# Patient Record
Sex: Female | Born: 1960 | Race: White | Hispanic: No | Marital: Married | Smoking: Never smoker
Health system: Southern US, Community
[De-identification: ages and names within clinical notes are randomized; demographics above are authoritative.]

## PROBLEM LIST (undated history)

## (undated) DIAGNOSIS — K5730 Diverticulosis of large intestine without perforation or abscess without bleeding: Secondary | ICD-10-CM

## (undated) DIAGNOSIS — E785 Hyperlipidemia, unspecified: Secondary | ICD-10-CM

## (undated) DIAGNOSIS — G4730 Sleep apnea, unspecified: Secondary | ICD-10-CM

## (undated) DIAGNOSIS — G4733 Obstructive sleep apnea (adult) (pediatric): Secondary | ICD-10-CM

## (undated) DIAGNOSIS — C55 Malignant neoplasm of uterus, part unspecified: Secondary | ICD-10-CM

## (undated) DIAGNOSIS — D126 Benign neoplasm of colon, unspecified: Secondary | ICD-10-CM

## (undated) DIAGNOSIS — K219 Gastro-esophageal reflux disease without esophagitis: Secondary | ICD-10-CM

## (undated) DIAGNOSIS — Z8669 Personal history of other diseases of the nervous system and sense organs: Secondary | ICD-10-CM

## (undated) DIAGNOSIS — M8580 Other specified disorders of bone density and structure, unspecified site: Secondary | ICD-10-CM

## (undated) DIAGNOSIS — F329 Major depressive disorder, single episode, unspecified: Secondary | ICD-10-CM

## (undated) DIAGNOSIS — E042 Nontoxic multinodular goiter: Secondary | ICD-10-CM

## (undated) HISTORY — PX: POLYPECTOMY: SHX149

## (undated) HISTORY — DX: Other specified disorders of bone density and structure, unspecified site: M85.80

## (undated) HISTORY — DX: Diverticulosis of large intestine without perforation or abscess without bleeding: K57.30

## (undated) HISTORY — DX: Hyperlipidemia, unspecified: E78.5

## (undated) HISTORY — DX: Gastro-esophageal reflux disease without esophagitis: K21.9

## (undated) HISTORY — DX: Nontoxic multinodular goiter: E04.2

## (undated) HISTORY — PX: MOLE REMOVAL: SHX2046

## (undated) HISTORY — DX: Malignant neoplasm of uterus, part unspecified: C55

## (undated) HISTORY — DX: Benign neoplasm of colon, unspecified: D12.6

## (undated) HISTORY — DX: Obstructive sleep apnea (adult) (pediatric): G47.33

## (undated) HISTORY — DX: Major depressive disorder, single episode, unspecified: F32.9

## (undated) HISTORY — DX: Sleep apnea, unspecified: G47.30

## (undated) HISTORY — PX: OTHER SURGICAL HISTORY: SHX169

## (undated) HISTORY — DX: Gilbert syndrome: E80.4

## (undated) HISTORY — PX: CHOLECYSTECTOMY: SHX55

## (undated) HISTORY — DX: Personal history of other diseases of the nervous system and sense organs: Z86.69

---

## 1993-06-12 HISTORY — PX: ESOPHAGEAL DILATION: SHX303

## 1993-06-12 HISTORY — PX: UPPER GASTROINTESTINAL ENDOSCOPY: SHX188

## 1994-09-21 ENCOUNTER — Encounter (INDEPENDENT_AMBULATORY_CARE_PROVIDER_SITE_OTHER): Payer: Self-pay | Admitting: *Deleted

## 1998-09-02 ENCOUNTER — Other Ambulatory Visit: Admission: RE | Admit: 1998-09-02 | Discharge: 1998-09-02 | Payer: Self-pay | Admitting: Gynecology

## 1999-01-07 ENCOUNTER — Encounter (INDEPENDENT_AMBULATORY_CARE_PROVIDER_SITE_OTHER): Payer: Self-pay | Admitting: *Deleted

## 1999-09-05 ENCOUNTER — Other Ambulatory Visit: Admission: RE | Admit: 1999-09-05 | Discharge: 1999-09-05 | Payer: Self-pay | Admitting: Gynecology

## 1999-09-15 ENCOUNTER — Ambulatory Visit (HOSPITAL_COMMUNITY): Admission: RE | Admit: 1999-09-15 | Discharge: 1999-09-15 | Payer: Self-pay | Admitting: Gynecology

## 1999-09-15 ENCOUNTER — Encounter (INDEPENDENT_AMBULATORY_CARE_PROVIDER_SITE_OTHER): Payer: Self-pay | Admitting: Specialist

## 2000-11-06 ENCOUNTER — Other Ambulatory Visit: Admission: RE | Admit: 2000-11-06 | Discharge: 2000-11-06 | Payer: Self-pay | Admitting: Gynecology

## 2000-11-23 ENCOUNTER — Encounter (INDEPENDENT_AMBULATORY_CARE_PROVIDER_SITE_OTHER): Payer: Self-pay | Admitting: *Deleted

## 2000-11-23 ENCOUNTER — Encounter: Payer: Self-pay | Admitting: Gastroenterology

## 2000-11-23 ENCOUNTER — Encounter: Admission: RE | Admit: 2000-11-23 | Discharge: 2000-11-23 | Payer: Self-pay | Admitting: Gastroenterology

## 2001-11-26 ENCOUNTER — Other Ambulatory Visit: Admission: RE | Admit: 2001-11-26 | Discharge: 2001-11-26 | Payer: Self-pay | Admitting: Gynecology

## 2003-02-23 ENCOUNTER — Other Ambulatory Visit: Admission: RE | Admit: 2003-02-23 | Discharge: 2003-02-23 | Payer: Self-pay | Admitting: Gynecology

## 2003-05-12 ENCOUNTER — Encounter: Payer: Self-pay | Admitting: Internal Medicine

## 2003-05-12 ENCOUNTER — Encounter (INDEPENDENT_AMBULATORY_CARE_PROVIDER_SITE_OTHER): Payer: Self-pay | Admitting: *Deleted

## 2003-05-12 ENCOUNTER — Ambulatory Visit (HOSPITAL_COMMUNITY): Admission: RE | Admit: 2003-05-12 | Discharge: 2003-05-12 | Payer: Self-pay | Admitting: Gastroenterology

## 2003-10-09 ENCOUNTER — Encounter: Payer: Self-pay | Admitting: Internal Medicine

## 2004-03-07 ENCOUNTER — Other Ambulatory Visit: Admission: RE | Admit: 2004-03-07 | Discharge: 2004-03-07 | Payer: Self-pay | Admitting: Gynecology

## 2005-01-30 ENCOUNTER — Ambulatory Visit: Payer: Self-pay | Admitting: Internal Medicine

## 2005-02-10 ENCOUNTER — Ambulatory Visit: Payer: Self-pay | Admitting: Internal Medicine

## 2005-03-13 ENCOUNTER — Other Ambulatory Visit: Admission: RE | Admit: 2005-03-13 | Discharge: 2005-03-13 | Payer: Self-pay | Admitting: Gynecology

## 2005-03-14 ENCOUNTER — Other Ambulatory Visit: Admission: RE | Admit: 2005-03-14 | Discharge: 2005-03-14 | Payer: Self-pay | Admitting: Gynecology

## 2005-03-29 ENCOUNTER — Ambulatory Visit: Payer: Self-pay | Admitting: Gastroenterology

## 2006-08-22 ENCOUNTER — Ambulatory Visit: Payer: Self-pay | Admitting: Gastroenterology

## 2007-01-29 ENCOUNTER — Encounter (INDEPENDENT_AMBULATORY_CARE_PROVIDER_SITE_OTHER): Payer: Self-pay | Admitting: *Deleted

## 2007-01-29 ENCOUNTER — Ambulatory Visit: Payer: Self-pay | Admitting: Internal Medicine

## 2007-01-29 DIAGNOSIS — K219 Gastro-esophageal reflux disease without esophagitis: Secondary | ICD-10-CM

## 2007-01-29 DIAGNOSIS — E785 Hyperlipidemia, unspecified: Secondary | ICD-10-CM

## 2007-01-29 DIAGNOSIS — M25569 Pain in unspecified knee: Secondary | ICD-10-CM

## 2007-01-29 LAB — CONVERTED CEMR LAB
Bilirubin Urine: NEGATIVE
Blood in Urine, dipstick: NEGATIVE
Glucose, Urine, Semiquant: NEGATIVE
Ketones, urine, test strip: NEGATIVE
Nitrite: NEGATIVE
Protein, U semiquant: NEGATIVE
Specific Gravity, Urine: <1.005
Urobilinogen, UA: NEGATIVE
WBC Urine, dipstick: NEGATIVE
pH: 6

## 2007-02-03 LAB — CONVERTED CEMR LAB
ALT: 19 units/L (ref 0–35)
AST: 19 units/L (ref 0–37)
Albumin: 4.1 g/dL (ref 3.5–5.2)
Alkaline Phosphatase: 49 units/L (ref 39–117)
BUN: 10 mg/dL (ref 6–23)
Basophils Absolute: 0.1 10*3/uL (ref 0.0–0.1)
Basophils Relative: 0.7 % (ref 0.0–1.0)
Bilirubin, Direct: 0.1 mg/dL (ref 0.0–0.3)
CO2: 29 meq/L (ref 19–32)
Calcium: 9.3 mg/dL (ref 8.4–10.5)
Chloride: 105 meq/L (ref 96–112)
Cholesterol: 234 mg/dL (ref 0–200)
Creatinine, Ser: 0.7 mg/dL (ref 0.4–1.2)
Direct LDL: 133.2 mg/dL
Eosinophils Absolute: 0.2 10*3/uL (ref 0.0–0.6)
Eosinophils Relative: 2 % (ref 0.0–5.0)
GFR calc Af Amer: 115 mL/min
GFR calc non Af Amer: 95 mL/min
Glucose, Bld: 85 mg/dL (ref 70–99)
HCT: 38 % (ref 36.0–46.0)
HDL: 73.1 mg/dL (ref >39.0)
Hemoglobin: 12.8 g/dL (ref 12.0–15.0)
Lymphocytes Relative: 39 % (ref 12.0–46.0)
MCHC: 33.6 g/dL (ref 30.0–36.0)
MCV: 86.9 fL (ref 78.0–100.0)
Monocytes Absolute: 0.6 10*3/uL (ref 0.2–0.7)
Monocytes Relative: 8.3 % (ref 3.0–11.0)
Neutro Abs: 3.9 10*3/uL (ref 1.4–7.7)
Neutrophils Relative %: 50 % (ref 43.0–77.0)
Platelets: 312 10*3/uL (ref 150–400)
Potassium: 3.5 meq/L (ref 3.5–5.1)
RBC: 4.38 M/uL (ref 3.87–5.11)
RDW: 15.7 % — ABNORMAL HIGH (ref 11.5–14.6)
Rheumatoid fact SerPl-aCnc: <20 intl units/mL — ABNORMAL LOW (ref 0.0–20.0)
Sed Rate: 12 mm/hr (ref 0–25)
Sodium: 140 meq/L (ref 135–145)
TSH: 0.51 microintl units/mL (ref 0.35–5.50)
Total Bilirubin: 1.4 mg/dL — ABNORMAL HIGH (ref 0.3–1.2)
Total CHOL/HDL Ratio: 3.2
Total CK: 61 units/L (ref 7–177)
Total Protein: 7.3 g/dL (ref 6.0–8.3)
Triglycerides: 115 mg/dL (ref 0–149)
Uric Acid, Serum: 3.7 mg/dL (ref 2.4–7.0)
VLDL: 23 mg/dL (ref 0–40)
WBC: 7.8 10*3/uL (ref 4.5–10.5)

## 2007-02-04 ENCOUNTER — Encounter (INDEPENDENT_AMBULATORY_CARE_PROVIDER_SITE_OTHER): Payer: Self-pay | Admitting: *Deleted

## 2007-06-13 DIAGNOSIS — F32A Depression, unspecified: Secondary | ICD-10-CM

## 2007-06-13 HISTORY — DX: Depression, unspecified: F32.A

## 2007-12-23 ENCOUNTER — Ambulatory Visit: Payer: Self-pay | Admitting: Internal Medicine

## 2008-06-12 DIAGNOSIS — C541 Malignant neoplasm of endometrium: Secondary | ICD-10-CM

## 2008-06-12 HISTORY — DX: Malignant neoplasm of endometrium: C54.1

## 2008-06-12 HISTORY — PX: TOTAL ABDOMINAL HYSTERECTOMY: SHX209

## 2008-08-20 ENCOUNTER — Encounter: Admission: RE | Admit: 2008-08-20 | Discharge: 2008-08-20 | Payer: Self-pay | Admitting: Gynecology

## 2008-08-27 ENCOUNTER — Ambulatory Visit: Payer: Self-pay | Admitting: Internal Medicine

## 2008-08-27 DIAGNOSIS — K222 Esophageal obstruction: Secondary | ICD-10-CM

## 2008-08-27 HISTORY — DX: Esophageal obstruction: K22.2

## 2008-10-16 ENCOUNTER — Telehealth: Payer: Self-pay | Admitting: Internal Medicine

## 2008-10-16 ENCOUNTER — Ambulatory Visit: Payer: Self-pay | Admitting: Internal Medicine

## 2008-10-16 DIAGNOSIS — C55 Malignant neoplasm of uterus, part unspecified: Secondary | ICD-10-CM

## 2008-10-16 LAB — CONVERTED CEMR LAB
Cholesterol, target level: 200 mg/dL
HDL goal, serum: 40 mg/dL

## 2008-11-04 ENCOUNTER — Ambulatory Visit: Payer: Self-pay | Admitting: Internal Medicine

## 2008-11-04 DIAGNOSIS — K5730 Diverticulosis of large intestine without perforation or abscess without bleeding: Secondary | ICD-10-CM | POA: Insufficient documentation

## 2008-11-04 DIAGNOSIS — D649 Anemia, unspecified: Secondary | ICD-10-CM

## 2008-11-04 LAB — CONVERTED CEMR LAB
Bilirubin Urine: NEGATIVE
Glucose, Urine, Semiquant: NEGATIVE
Protein, U semiquant: NEGATIVE
Specific Gravity, Urine: 1.01
pH: 6

## 2008-11-08 LAB — CONVERTED CEMR LAB
Basophils Relative: 0 % (ref 0.0–3.0)
Eosinophils Relative: 8.8 % — ABNORMAL HIGH (ref 0.0–5.0)
Folate: 10 ng/mL
Iron: 27 ug/dL — ABNORMAL LOW (ref 42–145)
Lymphocytes Relative: 28.8 % (ref 12.0–46.0)
Neutrophils Relative %: 53.1 % (ref 43.0–77.0)
Platelets: 391 10*3/uL (ref 150.0–400.0)
RBC: 3.88 M/uL (ref 3.87–5.11)
Total CHOL/HDL Ratio: 4
Vitamin B-12: 241 pg/mL (ref 211–911)
WBC: 8 10*3/uL (ref 4.5–10.5)

## 2008-11-10 ENCOUNTER — Telehealth (INDEPENDENT_AMBULATORY_CARE_PROVIDER_SITE_OTHER): Payer: Self-pay | Admitting: *Deleted

## 2008-11-10 ENCOUNTER — Encounter (INDEPENDENT_AMBULATORY_CARE_PROVIDER_SITE_OTHER): Payer: Self-pay | Admitting: *Deleted

## 2008-11-11 ENCOUNTER — Ambulatory Visit: Admission: RE | Admit: 2008-11-11 | Discharge: 2008-11-11 | Payer: Self-pay | Admitting: Gynecologic Oncology

## 2008-12-04 ENCOUNTER — Telehealth (INDEPENDENT_AMBULATORY_CARE_PROVIDER_SITE_OTHER): Payer: Self-pay | Admitting: *Deleted

## 2008-12-18 ENCOUNTER — Ambulatory Visit: Payer: Self-pay | Admitting: Internal Medicine

## 2008-12-18 DIAGNOSIS — F329 Major depressive disorder, single episode, unspecified: Secondary | ICD-10-CM

## 2008-12-22 ENCOUNTER — Encounter (INDEPENDENT_AMBULATORY_CARE_PROVIDER_SITE_OTHER): Payer: Self-pay | Admitting: *Deleted

## 2008-12-22 LAB — CONVERTED CEMR LAB: BUN: 15 mg/dL (ref 6–23)

## 2009-02-03 ENCOUNTER — Ambulatory Visit: Admission: RE | Admit: 2009-02-03 | Discharge: 2009-02-03 | Payer: Self-pay | Admitting: Gynecologic Oncology

## 2009-02-03 ENCOUNTER — Other Ambulatory Visit: Admission: RE | Admit: 2009-02-03 | Discharge: 2009-02-03 | Payer: Self-pay | Admitting: Gynecologic Oncology

## 2009-02-03 ENCOUNTER — Encounter (INDEPENDENT_AMBULATORY_CARE_PROVIDER_SITE_OTHER): Payer: Self-pay | Admitting: Gynecologic Oncology

## 2009-04-29 ENCOUNTER — Ambulatory Visit: Payer: Self-pay | Admitting: Internal Medicine

## 2009-05-02 LAB — CONVERTED CEMR LAB
Basophils Relative: 1.1 % (ref 0.0–3.0)
Cholesterol: 219 mg/dL — ABNORMAL HIGH (ref 0–200)
Eosinophils Relative: 3 % (ref 0.0–5.0)
HCT: 39.4 % (ref 36.0–46.0)
Hgb A1c MFr Bld: 5.3 % (ref 4.6–6.5)
Lymphs Abs: 2.1 10*3/uL (ref 0.7–4.0)
MCV: 94.1 fL (ref 78.0–100.0)
Monocytes Absolute: 0.6 10*3/uL (ref 0.1–1.0)
Monocytes Relative: 12.1 % — ABNORMAL HIGH (ref 3.0–12.0)
Neutrophils Relative %: 43.6 % (ref 43.0–77.0)
RBC: 4.19 M/uL (ref 3.87–5.11)
Total CHOL/HDL Ratio: 3
Triglycerides: 84 mg/dL (ref 0.0–149.0)
VLDL: 16.8 mg/dL (ref 0.0–40.0)
WBC: 5.2 10*3/uL (ref 4.5–10.5)

## 2009-08-09 ENCOUNTER — Encounter: Payer: Self-pay | Admitting: Internal Medicine

## 2010-02-07 ENCOUNTER — Ambulatory Visit: Payer: Self-pay | Admitting: Internal Medicine

## 2010-02-21 ENCOUNTER — Encounter: Payer: Self-pay | Admitting: Internal Medicine

## 2010-03-31 ENCOUNTER — Encounter: Payer: Self-pay | Admitting: Internal Medicine

## 2010-05-03 ENCOUNTER — Encounter: Payer: Self-pay | Admitting: Internal Medicine

## 2010-05-03 ENCOUNTER — Ambulatory Visit: Payer: Self-pay | Admitting: Internal Medicine

## 2010-05-03 DIAGNOSIS — R519 Headache, unspecified: Secondary | ICD-10-CM | POA: Insufficient documentation

## 2010-05-03 DIAGNOSIS — Z9189 Other specified personal risk factors, not elsewhere classified: Secondary | ICD-10-CM | POA: Insufficient documentation

## 2010-05-03 DIAGNOSIS — R51 Headache: Secondary | ICD-10-CM | POA: Insufficient documentation

## 2010-05-03 HISTORY — DX: Headache: R51

## 2010-05-06 LAB — CONVERTED CEMR LAB
Albumin: 4 g/dL (ref 3.5–5.2)
Alkaline Phosphatase: 98 units/L (ref 39–117)
Basophils Absolute: 0.1 10*3/uL (ref 0.0–0.1)
Basophils Relative: 1.2 % (ref 0.0–3.0)
CO2: 28 meq/L (ref 19–32)
Calcium: 9.3 mg/dL (ref 8.4–10.5)
Chloride: 102 meq/L (ref 96–112)
Eosinophils Absolute: 0.1 10*3/uL (ref 0.0–0.7)
Glucose, Bld: 90 mg/dL (ref 70–99)
HCT: 41 % (ref 36.0–46.0)
Hemoglobin: 13.8 g/dL (ref 12.0–15.0)
Lymphocytes Relative: 36 % (ref 12.0–46.0)
Lymphs Abs: 2.2 10*3/uL (ref 0.7–4.0)
MCHC: 33.7 g/dL (ref 30.0–36.0)
MCV: 91.5 fL (ref 78.0–100.0)
Monocytes Absolute: 0.5 10*3/uL (ref 0.1–1.0)
Neutro Abs: 3.1 10*3/uL (ref 1.4–7.7)
RBC: 4.48 M/uL (ref 3.87–5.11)
RDW: 14.2 % (ref 11.5–14.6)
Sodium: 141 meq/L (ref 135–145)
TSH: 0.86 microintl units/mL (ref 0.35–5.50)
Total CHOL/HDL Ratio: 4
Total Protein: 6.7 g/dL (ref 6.0–8.3)
Triglycerides: 141 mg/dL (ref 0.0–149.0)

## 2010-05-19 ENCOUNTER — Ambulatory Visit: Payer: Self-pay | Admitting: Pulmonary Disease

## 2010-05-19 DIAGNOSIS — G4763 Sleep related bruxism: Secondary | ICD-10-CM

## 2010-06-12 DIAGNOSIS — D126 Benign neoplasm of colon, unspecified: Secondary | ICD-10-CM

## 2010-06-12 HISTORY — PX: COLONOSCOPY: SHX174

## 2010-06-12 HISTORY — DX: Benign neoplasm of colon, unspecified: D12.6

## 2010-07-08 ENCOUNTER — Encounter: Payer: Self-pay | Admitting: Pulmonary Disease

## 2010-07-08 ENCOUNTER — Ambulatory Visit (HOSPITAL_BASED_OUTPATIENT_CLINIC_OR_DEPARTMENT_OTHER)
Admission: RE | Admit: 2010-07-08 | Discharge: 2010-07-08 | Payer: Self-pay | Source: Home / Self Care | Attending: Pulmonary Disease | Admitting: Pulmonary Disease

## 2010-07-12 DIAGNOSIS — G4733 Obstructive sleep apnea (adult) (pediatric): Secondary | ICD-10-CM

## 2010-07-12 NOTE — Procedures (Signed)
Summary: 24 hr pH Monitor Study/[facility]  24 hr pH Monitor Study/[facility]   Imported By: Sherian Rein 02/08/2010 12:23:12  _____________________________________________________________________  External Attachment:    Type:   Image     Comment:   External Document

## 2010-07-12 NOTE — Letter (Signed)
Summary: [facility] Gynecologic Oncology  [facility] Gynecologic Oncology   Imported By: Lanelle Bal 03/02/2010 09:28:45  _____________________________________________________________________  External Attachment:    Type:   Image     Comment:   External Document

## 2010-07-12 NOTE — Assessment & Plan Note (Signed)
Summary: SNORING///KP   Visit Type:  Initial Consult Copy to:  Dr. Alwyn Ren Primary Provider/Referring Provider:  Marga Melnick, MD  CC:  Sleep consult.Marland KitchenMarland KitchenEpworth score is 5..  History of Present Illness: 50 yo female with sleep difficulties.  She was diagnosed with uterine cancer about 2 years ago and had hysterectomy with removal of her ovaries.  After surgery she has gained about 20 lbs.  With this change in her weight her sleep has gotten worse, particularly over the past several months.  She has also been getting headaches in the morning.  As a result sleep consultation was requested.  She snores, and her husband now sleeps in a separate room.  She will wake up with a cough and gasp.  She has trouble sleeping on her back.  She goes to bed at 11pm, and falls asleep quickly.  She use the bathroom once per night.  She gets out of bed at 7 am, but this is a struggle.  She seems to always have a headache now.  She does not use anything to help her sleep or stay awake.  She does have reflux, and this seems to be worse at night.  She does grind her teeth while asleep, and this seems to be worse recently.  She denies sleep walking, sleep talking, or nightmares.  There is no history of restless legs.  She denies sleep hallucination, sleep paralysis, or cataplexy.  There is no history of thyroid disease.  She does not smoke cigarettes, or drink alcohol.   Preventive Screening-Counseling & Management  Alcohol-Tobacco     Smoking Status: never  Current Medications (verified): 1)  Prevacid 30 Mg Cpdr (Lansoprazole) .Marland Kitchen.. 1 By Mouth Two Times A Day 2)  Fish Oil 1000 Mg Caps (Omega-3 Fatty Acids) .... Take One By Mouth Once Daily 3)  Citalopram Hydrobromide 20 Mg Tabs (Citalopram Hydrobromide) .Marland Kitchen.. 1 Once Daily 4)  Furosemide 20 Mg Tabs (Furosemide) .Marland Kitchen.. 1 Once Daily As Needed Edema  Allergies (verified): 1)  ! Axid 2)  Compazine 3)  Pcn 4)  Prilosec 5)  Lipitor 6)  Nexium  Past  History:  Past Medical History: Reviewed history from 05/03/2010 and no changes required. GERD diagnosed in  November 11, 1993 extensively evaluated by GI, seen every 2 yrs; Esophageal dilation 11/11/93 Hyperlipidemia: LDL goal = < 160 based on  both  Framingham Study & NMR Lipoprofile, ideally < 125 (NMR 2002-11-12: LDL 168 with 1788 total / 124 small dense particles, HDL 72, TG 229) Gilbert's Syndrome Diverticulosis, colon Uterine Cancer Nov 11, 2008, Dr Greta Doom & Dr Kyla Balzarine, [facility]  Past Surgical History: Reviewed history from 05/03/2010 and no changes required. Cholecystectomy 1989 G2 P2; Flex Sigmoidoscopy  1995; Esophgeal dilation 1995;  Robotic TAH  & BSO & LN resection for Stage 1A  uterine cancer  Family History: Reviewed history from 05/03/2010 and no changes required. Father: HTN, MI @ 80, stents, died November 11, 2009 of metastatic melanoma Mother:breast cancer ,lipids  Siblings: bro: HTN  Paternal  Uncle: colon cancer  MGF: prostate cancer daughter : migraines  Social History: Reviewed history from 05/03/2010 and no changes required. 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  Alcohol Use - no Daily Caffeine Use:  2  diet sodas Regular exercise-no  Review of Systems       The patient complains of productive cough, non-productive cough, acid heartburn, indigestion, difficulty swallowing, headaches, nasal congestion/difficulty breathing through nose, hand/feet swelling, and joint stiffness or pain.  The patient denies shortness of breath with activity,  shortness of breath at rest, coughing up blood, chest pain, irregular heartbeats, loss of appetite, weight change, abdominal pain, sore throat, tooth/dental problems, sneezing, itching, ear ache, anxiety, depression, rash, change in color of mucus, and fever.    Vital Signs:  Patient profile:   50 year old female Height:      64.75 inches (164.47 cm) Weight:      172.50 pounds (78.41 kg) BMI:     29.03 O2 Sat:      99 % on Room  air Temp:     98.1 degrees F (36.72 degrees C) oral Pulse rate:   57 / minute BP sitting:   126 / 70  (left arm) Cuff size:   large  Vitals Entered By: Michel Bickers CMA (May 19, 2010 11:40 AM)  O2 Sat at Rest %:  99 O2 Flow:  Room air CC: Sleep consult.Marland KitchenMarland KitchenEpworth score is 5. Comments Medications reviewed with patient Michel Bickers [facility]  May 19, 2010 11:41 AM   Physical Exam  General:  normal appearance and healthy appearing.   Eyes:  PERRLA and EOMI, wears glasses Nose:  narrow nasal angles Mouth:  MP 4, scalloped tongue, elongated uvula Neck:  no JVD.   Chest Wall:  no deformities noted Lungs:  clear bilaterally to auscultation and percussion Heart:  regular rate and rhythm, S1, S2 without murmurs, rubs, gallops, or clicks Abdomen:  bowel sounds positive; abdomen soft and non-tender without masses, or organomegaly Extremities:  no clubbing, cyanosis, edema, or deformity noted Neurologic:  normal CN II-XII and strength normal.   Cervical Nodes:  no significant adenopathy Psych:  alert and cooperative; normal mood and affect; normal attention span and concentration   Impression & Recommendations:  Problem # 1:  SNORING, HX OF (ICD-V15.89) She has sleep disruption and daytime sleepiness.  I am concerned she has sleep apnea.  I have discussed how sleep apnea can affect her health.  Driving precautions were reviewed.  Discussed the importance of weight reduction.  Will schedule sleep test to further evaluate.  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  Complete Medication List: 1)  Prevacid 30 Mg Cpdr (Lansoprazole) .Marland Kitchen.. 1 by mouth two times a day 2)  Fish Oil 1000 Mg Caps (Omega-3 fatty acids) .... Take one by mouth once daily 3)  Citalopram Hydrobromide 20 Mg Tabs (Citalopram hydrobromide) .Marland Kitchen.. 1 once daily 4)  Furosemide 20 Mg Tabs (Furosemide) .Marland Kitchen.. 1 once daily as needed edema  Other Orders: Est. Patient Level IV  (56387) Sleep Disorder Referral (Sleep Disorder)  Patient Instructions: 1)  Will schedule sleep test 2)  Will call to schedule follow up after sleep test reviewed

## 2010-07-12 NOTE — Miscellaneous (Signed)
Summary: Change Prevacid Rx. to capsules  Clinical Lists Changes  Medications: Changed medication from PREVACID SOLUTAB 30 MG TBDP (LANSOPRAZOLE) Take 1 tablet by mouth two times a day to PREVACID 30 MG CPDR (LANSOPRAZOLE) 1 by mouth two times a day - Signed Rx of PREVACID 30 MG CPDR (LANSOPRAZOLE) 1 by mouth two times a day;  #60 x 11;  Signed;  Entered by: Milford Cage NCMA;  Authorized by: Hilarie Fredrickson MD;  Method used: Electronically to [facility] [phone]*, [address] [place], [place]  [address], Ph: [phone], Fax: [phone]    Prescriptions: PREVACID 30 MG CPDR (LANSOPRAZOLE) 1 by mouth two times a day  #60 x 11   Entered by:   Milford Cage NCMA   Authorized by:   Hilarie Fredrickson MD   Signed by:   Milford Cage NCMA on 03/31/2010   Method used:   Electronically to        [facility] [phone]* (retail)       [address] [place], [place]  [address]       Ph: [phone]       Fax: [phone]   RxID:   [id number]

## 2010-07-12 NOTE — Procedures (Signed)
Summary: Upper Endoscopy/[facility]  Upper Endoscopy/[facility]   Imported By: Sherian Rein 02/08/2010 12:17:36  _____________________________________________________________________  External Attachment:    Type:   Image     Comment:   External Document

## 2010-07-12 NOTE — Letter (Signed)
Summary: [facility] Instructions  [facility]  [address] [place], [place] [address]   Phone: [phone]  Fax: [phone]       Penny Valdez    1961-05-25    MRN: [id number]        Procedure Day /Date:TUESDAY, 03/22/10     Arrival Time:1:30 PM     Procedure Time:2:30 PM     Location of Procedure:                    X  [facility] (4th Floor)   PREPARATION FOR COLONOSCOPY WITH MOVIPREP   Starting 5 days prior to your procedure 03/17/10 do not eat nuts, seeds, popcorn, corn, beans, peas,  salads, or any raw vegetables.  Do not take any fiber supplements (e.g. Metamucil, Citrucel, and Benefiber).  THE DAY BEFORE YOUR PROCEDURE         DATE: 03/18/10  DAY: MONDAY  1.  Drink clear liquids the entire day-NO SOLID FOOD  2.  Do not drink anything colored red or purple.  Avoid juices with pulp.  No orange juice.  3.  Drink at least 64 oz. (8 glasses) of fluid/clear liquids during the day to prevent dehydration and help the prep work efficiently.  CLEAR LIQUIDS INCLUDE: Water Jello Ice Popsicles Tea (sugar ok, no milk/cream) Powdered fruit flavored drinks Coffee (sugar ok, no milk/cream) Gatorade Juice: apple, white grape, white cranberry  Lemonade Clear bullion, consomm, broth Carbonated beverages (any kind) Strained chicken noodle soup Hard Candy                             4.  In the morning, mix first dose of MoviPrep solution:    Empty 1 Pouch A and 1 Pouch B into the disposable container    Add lukewarm drinking water to the top line of the container. Mix to dissolve    Refrigerate (mixed solution should be used within 24 hrs)  5.  Begin drinking the prep at 5:00 p.m. The MoviPrep container is divided by 4 marks.   Every 15 minutes drink the solution down to the next mark (approximately 8 oz) until the full liter is complete.   6.  Follow completed prep with 16 oz of clear liquid of your choice (Nothing red or purple).  Continue to  drink clear liquids until bedtime.  7.  Before going to bed, mix second dose of MoviPrep solution:    Empty 1 Pouch A and 1 Pouch B into the disposable container    Add lukewarm drinking water to the top line of the container. Mix to dissolve    Refrigerate  THE DAY OF YOUR PROCEDURE      DATE: 03/22/10 DAY: TUESDAY  Beginning at 9:30 a.m. (5 hours before procedure):         1. Every 15 minutes, drink the solution down to the next mark (approx 8 oz) until the full liter is complete.  2. Follow completed prep with 16 oz. of clear liquid of your choice.    3. You may drink clear liquids until 12:30 PM (2 HOURS BEFORE PROCEDURE).   MEDICATION INSTRUCTIONS  Unless otherwise instructed, you should take regular prescription medications with a small sip of water   as early as possible the morning of your procedure.        OTHER INSTRUCTIONS  You will need a responsible adult at least 51 years of age to  accompany you and drive you home.   This person must remain in the waiting room during your procedure.  Wear loose fitting clothing that is easily removed.  Leave jewelry and other valuables at home.  However, you may wish to bring a book to read or  an iPod/MP3 player to listen to music as you wait for your procedure to start.  Remove all body piercing jewelry and leave at home.  Total time from sign-in until discharge is approximately 2-3 hours.  You should go home directly after your procedure and rest.  You can resume normal activities the  day after your procedure.  The day of your procedure you should not:   Drive   Make legal decisions   Operate machinery   Drink alcohol   Return to work  You will receive specific instructions about eating, activities and medications before you leave.    The above instructions have been reviewed and explained to me by   _______________________    I fully understand and can verbalize these instructions  _____________________________ Date _________

## 2010-07-12 NOTE — Assessment & Plan Note (Signed)
Summary: cpx & lab/cbs   Vital Signs:  Patient profile:   50 year old female Height:      64.75 inches Weight:      171.2 pounds BMI:     28.81 Temp:     98.2 degrees F oral Pulse rate:   64 / minute Resp:     14 per minute BP sitting:   130 / 88  (left arm) Cuff size:   large  Vitals Entered By: Shonna Chock CMA (May 03, 2010 8:40 AM) CC: CPX and fasting labs , Lipid Management Comments Height recorded with shoes on Refused flu vaccine   Primary Care Provider:  Marga Melnick, MD  CC:  CPX and fasting labs  and Lipid Management.  History of Present Illness: Mrs.Timson is here for a physical; she has had more frequent headaches recently in context of stress.  No associated prodrome or aura present.These are relieved by Excedrin as needed. She lost her father this month to metastatic melanoma. Also her family is concerned about severe snoring. No apnea reported .  Lipid Management History:      Positive NCEP/ATP III risk factors include family history for ischemic heart disease (males less than 43 years old).  Negative NCEP/ATP III risk factors include female age less than 13 years old, non-diabetic, HDL cholesterol greater than 60, non-tobacco-user status, non-hypertensive, no ASHD (atherosclerotic heart disease), no prior stroke/TIA, no peripheral vascular disease, and no history of aortic aneurysm.     Current Medications (verified): 1)  Prevacid 30 Mg Cpdr (Lansoprazole) .Marland Kitchen.. 1 By Mouth Two Times A Day 2)  Fish Oil 1000 Mg Caps (Omega-3 Fatty Acids) .... Take One By Mouth Once Daily 3)  Citalopram Hydrobromide 20 Mg Tabs (Citalopram Hydrobromide) .Marland Kitchen.. 1 Once Daily 4)  Furosemide 20 Mg Tabs (Furosemide) .Marland Kitchen.. 1 Once Daily As Needed Edema  Allergies: 1)  ! Axid 2)  Compazine 3)  Pcn 4)  Prilosec 5)  Lipitor 6)  Nexium  Past History:  Past Medical History: GERD diagnosed in  1993/10/25 extensively evaluated by GI, seen every 2 yrs; Esophageal dilation  1993/10/25 Hyperlipidemia: LDL goal = < 160 based on  both  [place] Study & NMR Lipoprofile, ideally < 125 (NMR 10/26/2002: LDL 168 with 1788 total / 161 small dense particles, HDL 72, TG 229) Gilbert's Syndrome Diverticulosis, colon Uterine Cancer 2008-10-25, Dr Greta Doom & Dr Kyla Balzarine, [facility]  Past Surgical History: Cholecystectomy 10/26/1987 G2 P2; Flex Sigmoidoscopy  Oct 25, 1993; Esophgeal dilation 1995;  Robotic TAH  & BSO & LN resection for Stage 1A  uterine cancer  Family History: Father: HTN, MI @ 64, stents, died 10-25-09 of metastatic melanoma Mother:breast cancer ,lipids  Siblings: bro: HTN  Paternal  Uncle: colon cancer  MGF: prostate cancer daughter : migraines  Social History: Married  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  Alcohol Use - no Daily Caffeine Use:  2  diet sodas Regular exercise-no  Review of Systems General:  Complains of fatigue and sleep disorder. Eyes:  Denies blurring, double vision, and vision loss-both eyes. ENT:  Complains of nasal congestion; denies hoarseness and postnasal drainage; Occasional swallowing issues due to globus sensation. CV:  Denies chest pain or discomfort, leg cramps with exertion, and palpitations; Occasional limb edema , worse in heat. Resp:  Complains of hypersomnolence and morning headaches; denies cough and sputum productive. GI:  Denies abdominal pain, bloody stools, dark tarry stools, indigestion, loss of appetite, and vomiting; Some intermittent nausea. No dysphagia. GU:  Denies discharge, dysuria,  and hematuria. MS:  Denies joint pain, joint redness, joint swelling, low back pain, mid back pain, and thoracic pain. Derm:  Denies dryness, hair loss, and lesion(s); nails brittle. Neuro:  Denies numbness, tingling, and weakness. Psych:  Complains of easily tearful; denies anxiety, depression, easily angered, and irritability. Endo:  Denies cold intolerance, excessive hunger, excessive thirst, excessive urination, and heat  intolerance. Heme:  Denies abnormal bruising and bleeding. Allergy:  Complains of itching eyes; denies sneezing.  Physical Exam  General:  well-nourished;alert,appropriate and cooperative throughout examination Head:  Normocephalic and atraumatic without obvious abnormalities.  Eyes:  No corneal or conjunctival inflammation noted.Perrla. Funduscopic exam benign, without hemorrhages, exudates or papilledema.  Ears:  External ear exam shows no significant lesions or deformities.  Otoscopic examination reveals clear canals, tympanic membranes are intact bilaterally without bulging, retraction, inflammation or discharge. Hearing is grossly normal bilaterally. Nose:  External nasal examination shows no deformity or inflammation. Nasal mucosa are pink and moist without lesions or exudates. Mouth:  Oral mucosa and oropharynx without lesions or exudates.  Teeth in good repair. Slight crowding of oropharynx Neck:  No deformities, masses, or tenderness noted. Physiologic  asymmetry of thyroid, R>  Lungs:  Normal respiratory effort, chest expands symmetrically. Lungs are clear to auscultation, no crackles or wheezes. Heart:  regular rhythm, no murmur, no gallop, no rub, no JVD, no HJR, and bradycardia.   Abdomen:  Bowel sounds positive,abdomen soft and non-tender without masses, organomegaly or hernias noted. Genitalia:  Dr Greta Doom Msk:  No deformity or scoliosis noted of thoracic or lumbar spine.   Pulses:  R and L carotid,radial,dorsalis pedis and posterior tibial pulses are full and equal bilaterally Extremities:  No clubbing, cyanosis, edema, or deformity noted with normal full range of motion of all joints.   Neurologic:  alert & oriented X3 and DTRs symmetrical and normal.   Skin:  Intact without suspicious lesions or rashes Cervical Nodes:  No lymphadenopathy noted Axillary Nodes:  No palpable lymphadenopathy Psych:  memory intact for recent and remote, normally interactive, and good eye contact.      Impression & Recommendations:  Problem # 1:  ROUTINE GENERAL MEDICAL EXAM@HEALTH  CARE FACL (ICD-V70.0)  Orders: EKG w/ Interpretation (93000) Venipuncture (16109) TLB-Lipid Panel (80061-LIPID) TLB-BMP (Basic Metabolic Panel-BMET) (80048-METABOL) TLB-CBC Platelet - w/Differential (85025-CBCD) TLB-Hepatic/Liver Function Pnl (80076-HEPATIC) TLB-TSH (Thyroid Stimulating Hormone) (84443-TSH)  Problem # 2:  HEADACHE (ICD-784.0) in context of family health stress  Problem # 3:  SNORING, HX OF (ICD-V15.89)  with hypersomnulence & am headaches, R/O Sleep Apnea  Orders: Sleep Disorder Referral (Sleep Disorder)  Problem # 4:  HYPERLIPIDEMIA NEC/NOS (ICD-272.4)  Problem # 5:  GERD (ICD-530.81)  Her updated medication list for this problem includes:    Prevacid 30 Mg Cpdr (Lansoprazole) .Marland Kitchen... 1 by mouth two times a day  Complete Medication List: 1)  Prevacid 30 Mg Cpdr (Lansoprazole) .Marland Kitchen.. 1 by mouth two times a day 2)  Fish Oil 1000 Mg Caps (Omega-3 fatty acids) .... Take one by mouth once daily 3)  Citalopram Hydrobromide 20 Mg Tabs (Citalopram hydrobromide) .Marland Kitchen.. 1 once daily 4)  Furosemide 20 Mg Tabs (Furosemide) .Marland Kitchen.. 1 once daily as needed edema 5)  Fluticasone Propionate 50 Mcg/act Susp (Fluticasone propionate) .Marland Kitchen.. 1 spray two times a day as needed for nasal spray  Other Orders: Tdap => 64yrs IM (60454) Admin 1st Vaccine (09811)  Lipid Assessment/Plan:      Based on NCEP/ATP III, the patient's risk factor category is "0-1 risk factors".  The patient's lipid goals are as follows: Total cholesterol goal is 200; LDL cholesterol goal is 160; HDL cholesterol goal is 40; Triglyceride goal is 150.  Her LDL cholesterol goal has been met.    Patient Instructions: 1)  Please reschedule Colonoscopy  . Use a Neti Pot once daily as needed for nasal congestion. 2)  Avoid foods high in acid (tomatoes, citrus juices, spicy foods). Avoid eating within two hours of lying down or before  exercising. Do not over eat; try smaller more frequent meals. Elevate head of bed twelve inches when sleeping. Prescriptions: FLUTICASONE PROPIONATE 50 MCG/ACT SUSP (FLUTICASONE PROPIONATE) 1 spray two times a day as needed for nasal spray  #1 x 5   Entered and Authorized by:   Marga Melnick MD   Signed by:   Marga Melnick MD on 05/03/2010   Method used:   Print then Give to Patient   RxID:   [id number] FUROSEMIDE 20 MG TABS (FUROSEMIDE) 1 once daily as needed edema  #90 x 3   Entered and Authorized by:   Marga Melnick MD   Signed by:   Marga Melnick MD on 05/03/2010   Method used:   Print then Give to Patient   RxID:   [id number] CITALOPRAM HYDROBROMIDE 20 MG TABS (CITALOPRAM HYDROBROMIDE) 1 once daily  #90 x 3   Entered and Authorized by:   Marga Melnick MD   Signed by:   Marga Melnick MD on 05/03/2010   Method used:   Print then Give to Patient   RxID:   [id number]    Orders Added: 1)  Tdap => 21yrs IM [90715] 2)  Admin 1st Vaccine [90471] 3)  Est. Patient 40-64 years [99396] 4)  EKG w/ Interpretation [93000] 5)  Venipuncture [36415] 6)  TLB-Lipid Panel [80061-LIPID] 7)  TLB-BMP (Basic Metabolic Panel-BMET) [80048-METABOL] 8)  TLB-CBC Platelet - w/Differential [85025-CBCD] 9)  TLB-Hepatic/Liver Function Pnl [80076-HEPATIC] 10)  TLB-TSH (Thyroid Stimulating Hormone) [84443-TSH] 11)  Sleep Disorder Referral [Sleep Disorder]   Immunizations Administered:  Tetanus Vaccine:    Vaccine Type: Tdap    Site: right deltoid    Mfr: GlaxoSmithKline    Dose: 0.5 ml    Route: IM    Given by: Shonna Chock CMA    Exp. Date: 03/31/2012    Lot #: [id number]    VIS given: 04/29/08 version given May 03, 2010.   Immunizations Administered:  Tetanus Vaccine:    Vaccine Type: Tdap    Site: right deltoid    Mfr: GlaxoSmithKline    Dose: 0.5 ml    Route: IM    Given by: Shonna Chock CMA    Exp. Date: 03/31/2012    Lot #: [id number]    VIS given:  04/29/08 version given May 03, 2010.   Appended Document: cpx & lab/cbs

## 2010-07-12 NOTE — Assessment & Plan Note (Signed)
Summary: GERD symptoms / meds / colon cancer screening    History of Present Illness Visit Type: Follow-up Visit Primary GI MD: Yancey Flemings MD Primary Provider: Marga Melnick, MD Chief Complaint: Patient here for follow up of her gerd. She complains of epigastric pain that started about 2 months ago. She is also having a BM most days while eating lunch, she denies diarrhea.  History of Present Illness:   50 year old female with GERD, but due to peptic stricture, hyperlipidemia, and stage IA uterine cancer for which she is status post hysterectomy. She presents today regarding GERD and followup. Patient has been using Prevacid solutab's once daily. Despite this, she has had significant breakthrough symptoms with dietary indiscretion as well as nocturnally. She will take Rolaids as needed. This helps. No recurrent dysphagia. She has had 10 pound weight gain over the past year. She does his epigastric discomfort associated with breakthrough GERD. She also reports that need to defecate urgently, a formed stool, while eating lunch. We also discussed colon cancer screening.   GI Review of Systems    Reports abdominal pain, acid reflux, and  heartburn.     Location of  Abdominal pain: epigastric area.    Denies belching, bloating, chest pain, dysphagia with liquids, dysphagia with solids, loss of appetite, nausea, vomiting, vomiting blood, weight loss, and  weight gain.      Reports change in bowel habits.     Denies anal fissure, black tarry stools, constipation, diarrhea, diverticulosis, fecal incontinence, heme positive stool, hemorrhoids, irritable bowel syndrome, jaundice, light color stool, liver problems, rectal bleeding, and  rectal pain.    Current Medications (verified): 1)  Prevacid Solutab 30 Mg Tbdp (Lansoprazole) .... Take 1 Tablet By Mouth Once A Day 2)  Multivitamins  Tabs (Multiple Vitamin) .... Take 1 Tablet By Mouth Once A Day 3)  Fish Oil 1000 Mg Caps (Omega-3 Fatty Acids) ....  Take One By Mouth Once Daily 4)  Citalopram Hydrobromide 20 Mg Tabs (Citalopram Hydrobromide) .Marland Kitchen.. 1 Once Daily 5)  Furosemide 20 Mg Tabs (Furosemide) .Marland Kitchen.. 1 Once Daily As Needed Edema  Allergies (verified): 1)  ! Axid 2)  Compazine 3)  Pcn 4)  Prilosec 5)  Lipitor 6)  Nexium  Past History:  Past Medical History: GERD Dagnosed in  1995 extensively evaluated by GI, seen every 2 yrs; esophageal dilation 1995 Hyperlipidemia, LDL goal = < 160 based on NMR Lipoprofile, ideally < 125 Gilbert's Syndrome Diverticulosis, colon Uterine Cancer 2010  Past Surgical History: Cholecystectomy G2P2; Flex Sig 1995; Esophgeal dilation 1995;  Robotic TAH  & BSO & LN resection for Stage 1A  uterine CA  Family History: Father: HTN, MI @ 33, stents Mother:breast CA ,lipids  Siblings: bro HTN Family History of Colon Cancer: Pat Uncle Family History of Prostate Cancer: MGF Father Melanoma cancer daughter  migraines  Social History: Reviewed history from 10/16/2008 and no changes required. 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  Alcohol Use - no Daily Caffeine Use: sodas Illicit Drug Use - no Regular exercise-no  Review of Systems       The patient complains of back pain and swelling of feet/legs.  The patient denies allergy/sinus, anemia, anxiety-new, arthritis/joint pain, blood in urine, breast changes/lumps, change in vision, confusion, cough, coughing up blood, depression-new, fainting, fatigue, fever, headaches-new, hearing problems, heart murmur, heart rhythm changes, itching, menstrual pain, muscle pains/cramps, night sweats, nosebleeds, pregnancy symptoms, shortness of breath, skin rash, sleeping problems, sore throat, swollen lymph glands, thirst -  excessive , urination - excessive , urination changes/pain, urine leakage, vision changes, and voice change.    Vital Signs:  Patient profile:   50 year old female Height:      63.75  inches Weight:      169.6 pounds BMI:     29.45 Pulse rate:   76 / minute Pulse rhythm:   regular BP sitting:   106 / 68  (left arm) Cuff size:   regular  Vitals Entered By: Harlow Mares CMA Duncan Dull) (February 07, 2010 4:13 PM)  Physical Exam  General:  Well developed, well nourished, no acute distress. Head:  Normocephalic and atraumatic. Eyes:  PERRLA, no icterus. Mouth:  No deformity or lesions, dentition normal. Lungs:  Clear throughout to auscultation. Heart:  Regular rate and rhythm; no murmurs, rubs,  or bruits. Abdomen:  Soft, nontender and nondistended. No masses, hepatosplenomegaly or hernias noted. Normal bowel sounds. Rectal:  deferred until colonoscopy Msk:  Symmetrical with no gross deformities. Normal posture. Pulses:  Normal pulses noted. Extremities:  No clubbing, cyanosis, edema or deformities noted. Neurologic:  Alert and  oriented x4;  grossly normal neurologically. Skin:  Intact without significant lesions or rashes. Psych:  Alert and cooperative. Normal mood and affect.   Impression & Recommendations:  Problem # 1:  GERD (ICD-530.81) GERD with a history of peptic stricture (530.3). Now with significant breakthrough symptoms on once daily Prevacid solutab. Associated epigastric pain (789.06) related to the same. Fortunately,, no recurrent dysphagia. Suspect weight gain the cause for problems over the past year.  Plan: #1. Reflux precautions with attention to weight loss #2. Refill Prevacid solutab. Increased dosage to 30 mg b.i.d. #3. Routine GERD followup in one year.  Problem # 2:  SPECIAL SCREENING FOR MALIGNANT NEOPLASMS COLON (ICD-V76.51) the patient is an appropriate candidate for screening colonoscopy without contraindication. The nature of the procedure as well as the risks, benefits, and alternatives were reviewed in detail. She understood and agreed to proceed. Movi prep prescribed. The patient instructed on its use.  Other Orders: Colonoscopy  (Colon)  Patient Instructions: 1)  Copy sent to : Marga Melnick, MD 2)  Colonoscopy [facility] 03/22/10 2:30 pm arrive at 1:30 pm 3)  Movi prep instructions given to patient. 4)  Movi prep Rx. sent to pharmacy. 5)  Increase your Prevacid solutabs to two times a day #60 x 11 RFs. 6)  The medication list was reviewed and reconciled.  All changed / newly prescribed medications were explained.  A complete medication list was provided to the patient / caregiver. Prescriptions: PREVACID SOLUTAB 30 MG TBDP (LANSOPRAZOLE) Take 1 tablet by mouth two times a day  #90 x 3   Entered by:   Milford Cage NCMA   Authorized by:   Hilarie Fredrickson MD   Signed by:   Milford Cage NCMA on 02/07/2010   Method used:   Electronically to        [facility] [phone]* (retail)       [address] [place], [place]  [address]       Ph: [phone]       Fax: [phone]   RxID:   [id number] MOVIPREP 100 GM  SOLR (PEG-KCL-NACL-NASULF-NA ASC-C) As per prep instructions.  #1 x 0   Entered by:   Milford Cage NCMA   Authorized by:   Hilarie Fredrickson MD   Signed by:   Milford Cage NCMA on 02/07/2010   Method used:  Electronically to        [facility] [phone]* (retail)       [address] [place], [place]  [address]       Ph: [phone]       Fax: [phone]   RxID:   [id number]

## 2010-07-14 ENCOUNTER — Telehealth (INDEPENDENT_AMBULATORY_CARE_PROVIDER_SITE_OTHER): Payer: Self-pay | Admitting: *Deleted

## 2010-07-14 DIAGNOSIS — G4733 Obstructive sleep apnea (adult) (pediatric): Secondary | ICD-10-CM | POA: Insufficient documentation

## 2010-07-15 NOTE — Letter (Signed)
Summary: [facility]  [facility]   Imported By: Lanelle Bal 08/19/2009 10:53:30  _____________________________________________________________________  External Attachment:    Type:   Image     Comment:   External Document

## 2010-07-20 NOTE — Miscellaneous (Addendum)
Summary: Split night sleep study   Clinical Lists Changes AHI 21.7, significant REM and positional affect.  PLMI 0.  Used CPAP 7 cm with AHI down to 0.  Observed in REM and supine sleep.  Left message with patient's family member, and ask for patient to call back to discuss results.  Appended Document: Split night sleep study   Discussed result with pt over the phone.  Will set up CPAP 7 cm H2O.  Will have my nurse schedule ROV in 6 to 8 weeks.  Clinical Lists Changes  Problems: Added new problem of OBSTRUCTIVE SLEEP APNEA (ICD-327.23) Orders: Added new Referral order of DME Referral (DME) - Signed      Appended Document: Split night sleep study lmomtcb x1 to schedule rov  Appended Document: Split night sleep study lmomtcb x2  Appended Document: Split night sleep study pt is coming in 3/19 at 10:30

## 2010-07-20 NOTE — Progress Notes (Signed)
Summary: sleep results<<<dr sood spoke w/ patient  Phone Note Call from Patient   Caller: Patient Call For: sood Summary of Call: patient phoned stated that she was returning a call to Dr Craige Cotta for her test results of her sleep test. She can be reached at [phone] Initial call taken by: Vedia Coffer,  July 14, 2010 11:57 AM  Follow-up for Phone Call        Spoke with pt over the phone. Follow-up by: Coralyn Helling MD,  July 14, 2010 12:27 PM

## 2010-07-27 ENCOUNTER — Encounter: Payer: Self-pay | Admitting: Pulmonary Disease

## 2010-08-29 ENCOUNTER — Ambulatory Visit (INDEPENDENT_AMBULATORY_CARE_PROVIDER_SITE_OTHER): Payer: BC Managed Care – PPO | Admitting: Pulmonary Disease

## 2010-08-29 ENCOUNTER — Encounter: Payer: Self-pay | Admitting: Pulmonary Disease

## 2010-08-29 DIAGNOSIS — G4733 Obstructive sleep apnea (adult) (pediatric): Secondary | ICD-10-CM

## 2010-09-08 NOTE — Assessment & Plan Note (Signed)
Summary: follow up on cpap/ms   Copy to:  Dr. Alwyn Ren Primary Provider/Referring Provider:  Marga Melnick, MD  CC:  follow up on cpap. Pt states she wears her cpap everynight x8 hrs a night. Pt states her mask leaves marks on the top of her nose and becomes irritated. Marland Kitchen  History of Present Illness: 50 yo female with moderate OSA using CPAP 7 cm.  Sleep study from Jul 08, 2010>>AHI 21.7, significant REM and positional affect.  PLMI 0.  Used CPAP 7 cm with AHI down to 0.  Observed in REM and supine sleep.  Since then she has been set up with CPAP.  She is using every night.  She is sleeping better, and no longer snores.  She has more energy during the day.  She has a full face mask and heated humidity.  She gets occasional irritation over her nose from the mask.  Otherwise she is quite pleased with response to CPAP.  Current Medications (verified): 1)  Prevacid 30 Mg Cpdr (Lansoprazole) .Marland Kitchen.. 1 By Mouth Two Times A Day 2)  Fish Oil 1000 Mg Caps (Omega-3 Fatty Acids) .... Take One By Mouth Once Daily 3)  Furosemide 20 Mg Tabs (Furosemide) .Marland Kitchen.. 1 Once Daily As Needed Edema 4)  Cpap .... Uses At Night  Allergies (verified): 1)  ! Axid 2)  Compazine 3)  Pcn 4)  Prilosec 5)  Lipitor 6)  Nexium  Past History:  Past Medical History: GERD diagnosed in  Oct 28, 1993 extensively evaluated by GI, seen every 2 yrs; Esophageal dilation 1993-10-28 Hyperlipidemia: LDL goal = < 160 based on  both  [place] Study & NMR Lipoprofile, ideally < 125 (NMR Oct 29, 2002: LDL 168 with 1788 total / 914 small dense particles, HDL 72, TG 229) Gilbert's Syndrome Diverticulosis, colon Uterine Cancer 2008-10-28, Dr Greta Doom & Dr Kyla Balzarine, [facility] Obstructive sleep apnea      - PSG 07/08/10>>AHI 21.7      - CPAP 7 cm  Past Surgical History: Reviewed history from 05/03/2010 and no changes required. Cholecystectomy 1989 G2 P2; Flex Sigmoidoscopy  1995; Esophgeal dilation 1995;  Robotic TAH  & BSO & LN resection for Stage 1A  uterine  cancer  Family History: Reviewed history from 05/03/2010 and no changes required. Father: HTN, MI @ 60, stents, died Oct 28, 2009 of metastatic melanoma Mother:breast cancer ,lipids  Siblings: bro: HTN  Paternal  Uncle: colon cancer  MGF: prostate cancer daughter : migraines  Social History: Reviewed history from 05/03/2010 and no changes required. 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  Alcohol Use - no Daily Caffeine Use:  2  diet sodas Regular exercise-no  Vital Signs:  Patient profile:   50 year old female Height:      64.75 inches Weight:      172.50 pounds BMI:     29.03 O2 Sat:      99 % on Room air Temp:     98.1 degrees F oral Pulse rate:   60 / minute BP sitting:   114 / 66  (right arm) Cuff size:   regular  Vitals Entered By: Carver Fila (August 29, 2010 10:31 AM)  O2 Flow:  Room air CC: follow up on cpap. Pt states she wears her cpap everynight x8 hrs a night. Pt states her mask leaves marks on the top of her nose and becomes irritated.  Comments meds and allergies updated Phone number updated Carver Fila  August 29, 2010 10:32 AM    Physical Exam  General:  normal appearance and healthy appearing.   Nose:  narrow nasal angles Mouth:  MP 4, scalloped tongue, elongated uvula Neck:  no JVD.   Lungs:  clear bilaterally to auscultation and percussion Heart:  regular rate and rhythm, S1, S2 without murmurs, rubs, gallops, or clicks Extremities:  no clubbing, cyanosis, edema, or deformity noted Neurologic:  normal CN II-XII.   Cervical Nodes:  no significant adenopathy   Impression & Recommendations:  Problem # 1:  OBSTRUCTIVE SLEEP APNEA (ICD-327.23) She has moderate sleep apnea.  Reviewed her sleep test with her.  Discussed options for treating sleep apnea.  Reviewed driving precautions and weight loss.    She has done well with CPAP, and will continue this.  Discussed techniques to improve mask comfort.  Medications Added to  Medication List This Visit: 1)  Cpap  .... Uses at night  Complete Medication List: 1)  Prevacid 30 Mg Cpdr (Lansoprazole) .Marland Kitchen.. 1 by mouth two times a day 2)  Fish Oil 1000 Mg Caps (Omega-3 fatty acids) .... Take one by mouth once daily 3)  Furosemide 20 Mg Tabs (Furosemide) .Marland Kitchen.. 1 once daily as needed edema 4)  Cpap  .... Uses at night  Other Orders: Est. Patient Level III (29562)  Patient Instructions: 1)  Follow up in 6 months

## 2010-09-30 ENCOUNTER — Encounter: Payer: Self-pay | Admitting: Internal Medicine

## 2010-09-30 ENCOUNTER — Ambulatory Visit (AMBULATORY_SURGERY_CENTER): Payer: BC Managed Care – PPO | Admitting: *Deleted

## 2010-09-30 VITALS — Ht 64.5 in | Wt 169.8 lb

## 2010-09-30 DIAGNOSIS — Z1211 Encounter for screening for malignant neoplasm of colon: Secondary | ICD-10-CM

## 2010-09-30 MED ORDER — PEG-KCL-NACL-NASULF-NA ASC-C 100 G PO SOLR: ORAL | Status: DC

## 2010-10-10 ENCOUNTER — Encounter: Payer: Self-pay | Admitting: Internal Medicine

## 2010-10-10 ENCOUNTER — Ambulatory Visit (AMBULATORY_SURGERY_CENTER): Payer: BC Managed Care – PPO | Admitting: Internal Medicine

## 2010-10-10 VITALS — BP 115/69 | HR 58 | Temp 97.0°F | Resp 16

## 2010-10-10 DIAGNOSIS — Z1211 Encounter for screening for malignant neoplasm of colon: Secondary | ICD-10-CM

## 2010-10-10 DIAGNOSIS — D126 Benign neoplasm of colon, unspecified: Secondary | ICD-10-CM

## 2010-10-10 MED ORDER — SODIUM CHLORIDE 0.9 % IV SOLN: 500.0000 mL | INTRAVENOUS | Status: DC

## 2010-10-10 NOTE — Patient Instructions (Signed)
Follow discharge instructions.  Continue your medications. 

## 2010-10-11 ENCOUNTER — Telehealth: Payer: Self-pay

## 2010-10-11 NOTE — Telephone Encounter (Signed)

## 2010-10-17 ENCOUNTER — Other Ambulatory Visit: Payer: Self-pay | Admitting: Gynecology

## 2010-10-25 NOTE — Consult Note (Signed)
Penny Valdez, Penny Valdez                ACCOUNT NO.:  [id number]   MEDICAL RECORD NO.:  [id number]          PATIENT TYPE:  OUT   LOCATION:  GYN                          FACILITY:  [facility]   PHYSICIAN:  John T. Kyla Balzarine, M.D.    DATE OF BIRTH:  1961-01-03   DATE OF CONSULTATION:  02/03/2009  DATE OF DISCHARGE:                                 CONSULTATION   CHIEF COMPLAINT:  Followup of endometrial cancer.   HISTORY OF PRESENT ILLNESS:  This patient underwent robotic TLH and  lymph node dissection in May 2010 for endometrial cancer.  Final  pathology revealed stage IA grade III endometrial adenocarcinoma with  less than 10% myometrial invasion.  No angiolymphatic involvement and 69  lymph nodes free of tumor.  We elected no further treatment.  She has  returned to full activity and is working full time without limitations.  She has not yet resumed intercourse.  She does note leg edema, which did  antedate her surgery and has responded to low-dose thiazide therapy.  Bowel and bladder functions are normal and she denies pelvic or back  pain.   PAST MEDICAL HISTORY:  Significant for hyperlipidemia, GERD, with q.2  year esophageal evaluation and esophageal dilatation in the past.  Gilbert's syndrome.  Diverticulosis.  Prior cholecystectomy.  Robotic  hysterectomy and nodes.  NSVD x2.   PERSONAL/SOCIAL HISTORY:  The patient is married and nonsmoker.   FAMILY HISTORY:  Noncontributory.   ALLERGIES:  AXID.  COMPAZINE.  PENICILLIN.  PRILOSEC.  LIPITOR.  NEXIUM.   MEDICATIONS:  1. Prevacid.  2. Multivitamins with calcium.  3. Fish oil.  4. Vitamin D.   REVIEW OF SYSTEMS:  Other than above, negative 10-point system review.   EXAM:  VITAL SIGNS:  Weight 150 pounds, vital signs stable and afebrile.  LYMPHATICS:  Negative for pathologic lymphadenopathy.  BACK:  No spinous or CVA tenderness.  ABDOMEN:  Soft and benign with well-healed robotic trocar sites.  No  hernia, ascites, tenderness, or  mass.  EXTREMITIES:  Full strength and range of motion with no edema, cords, or  Homan sign.  PELVIC:  External genitalia, BUS, bladder, and urethra normal.  The  vagina is well supported with no cystocele at the cuff.  The vagina is  intact, but there is a 5 mm granulation tissue removed and treated with  silver nitrate.  Bimanual and rectovaginal examinations disclose absent  uterus and cervix with no mass or nodularity.   ASSESSMENT:  Endometrial carcinoma post robotic hysterectomy and  staging.  Plan:  I reviewed the hallmark symptoms of recurrent  endometrial cancer, and we discussed the possibility of a physical  therapy referral if edema becomes severe.  During this discussion, the  patient brought up the issue of hot flashes and the desire for  treatment.  We discussed the risks and benefits and elected to treat her  with Premarin 0.625 mg daily with the goal to taper her  off of ERT within a few years.  Answered multiple questions and the  patient is in agreement.  Because I may not be coming  to [place] in  the future, we can alternate follow up with Dr. Lodema Hong and I could  see her in [place] or she could be seen by one of my colleagues in  [place].      John T. Kyla Balzarine, M.D.  Electronically Signed     JTS/MEDQ  D:  02/03/2009  T:  02/03/2009  Job:  [id number]   cc:   Telford Nab, R.N.  [address]  [place], [place] [address]   Luvenia Redden, M.D.  Fax: [phone]

## 2010-10-25 NOTE — Consult Note (Signed)
NAMELORIJEAN, HUSSER                ACCOUNT NO.:  [id number]   MEDICAL RECORD NO.:  [id number]          PATIENT TYPE:  OUT   LOCATION:  GYN                          FACILITY:  [facility]   PHYSICIAN:  John T. Kyla Balzarine, M.D.    DATE OF BIRTH:  [birth year]   DATE OF CONSULTATION:  11/11/2008  DATE OF DISCHARGE:                                 CONSULTATION   CHIEF COMPLAINT:  Postoperative followup after robotic TLH/BSO and  endometrial cancer staging   HISTORY:  This patient underwent a robotic TLH/BSO with pelvic and  aortic node dissection at [facility] on May 13.  Final pathology revealed stage  IA grade 3 endometrioid adenocarcinoma with less than 10% myometrial  invasion and no angiolymphatic involvement.  Multiple lymph nodes were  negative.  The patient's postoperative convalescence has been marked by  bladder spasms, sluggish bowels and a general stiffness.  She is  using oxycodone sparingly and relying mostly on ibuprofen.  She denies  thigh or groin pain or leg swelling.  She notes that overall her level  of activity is improving.   PAST MEDICAL HISTORY:  Unchanged.   OBJECTIVE:  VITAL SIGNS:  Weight 156 pounds.  Vital signs stable,  afebrile.  ABDOMEN:  Has healing robotic trocar sites, without ecchymosis or  fascial defects.  There is minimal abdominal tenderness.  BACK:  No spinous or CVA tenderness.  EXTREMITIES:  No edema, cords or Homan's.  PELVIC:  Speculum examination reveals healing cuff.  On bimanual and  rectovaginal examinations, minimal postop induration with minimal  tenderness.   ASSESSMENT:  Healing satisfactorily post robotic TLH/BSO (total  laparoscopic hysterectomy/bilateral salpingo-oophorectomy) and staging  of endometrial cancer.   PLAN:  I discussed pathology in detail with the patient and her husband,  answered multiple questions.  We recommend no further treatment at this  juncture.  I discussed hallmark symptoms of recurrent cancer and  outlined conservative  followup including physical and pelvic examination  with Pap smear every 3 months for the first year  after surgery and then at 6 months to complete 5 years of followup.  We  would recommend alternating with her primary gynecologist (Dr. Greta Doom)  until she has completed her 5 year cancer followup.  We could see her  back in the [place] clinic in 3 months such that she would see Dr.  Greta Doom in 6 months.      John T. Kyla Balzarine, M.D.  Electronically Signed     JTS/MEDQ  D:  11/11/2008  T:  11/11/2008  Job:  [id number]   cc:   Luvenia Redden, M.D.  Fax: [phone]   Telford Nab, R.N.  [address]  [place], [place] [address]

## 2010-10-28 NOTE — Assessment & Plan Note (Signed)
[facility]                         GASTROENTEROLOGY OFFICE NOTE   NAME:[name], [name]                       MRN:          [id number]  DATE:08/22/2006                            DOB:          1960-08-09    Penny Valdez comes in just needing some med refills.  She is presently on  Prevacid SoluTabs, which she likes very much.  She takes some baby  aspirin and multivitamins and that is the only medicine she is presently  taking.  She says as long as she takes her medicine, she does well with  her GERD.  She has had no dysphagia.   PHYSICAL EXAMINATION:  On physical examination today, she weighted 152,  blood pressure 90/60, pulse 76 and regular.  ___________ unremarkable.   IMPRESSION:  GERD, controlled nicely with Prevacid daily.   RECOMMENDATIONS:  Continue medication and to call us if she had any  further difficulty.     Ulyess Mort, MD  Electronically Signed    SML/MedQ  DD: 08/22/2006  DT: 08/24/2006  Job #: [id number]

## 2011-03-31 ENCOUNTER — Encounter: Payer: Self-pay | Admitting: Internal Medicine

## 2011-03-31 ENCOUNTER — Ambulatory Visit (INDEPENDENT_AMBULATORY_CARE_PROVIDER_SITE_OTHER): Payer: BC Managed Care – PPO | Admitting: Internal Medicine

## 2011-03-31 VITALS — BP 124/90 | HR 55 | Temp 97.8°F | Resp 14 | Ht 64.25 in | Wt 166.0 lb

## 2011-03-31 DIAGNOSIS — K219 Gastro-esophageal reflux disease without esophagitis: Secondary | ICD-10-CM

## 2011-03-31 DIAGNOSIS — R509 Fever, unspecified: Secondary | ICD-10-CM

## 2011-03-31 DIAGNOSIS — E785 Hyperlipidemia, unspecified: Secondary | ICD-10-CM

## 2011-03-31 DIAGNOSIS — W57XXXA Bitten or stung by nonvenomous insect and other nonvenomous arthropods, initial encounter: Secondary | ICD-10-CM

## 2011-03-31 DIAGNOSIS — R5383 Other fatigue: Secondary | ICD-10-CM

## 2011-03-31 DIAGNOSIS — T148XXA Other injury of unspecified body region, initial encounter: Secondary | ICD-10-CM

## 2011-03-31 DIAGNOSIS — N959 Unspecified menopausal and perimenopausal disorder: Secondary | ICD-10-CM

## 2011-03-31 DIAGNOSIS — Z0000 Encounter for general adult medical examination without abnormal findings: Secondary | ICD-10-CM

## 2011-03-31 LAB — BASIC METABOLIC PANEL
CO2: 29 mEq/L (ref 19–32)
Calcium: 9.2 mg/dL (ref 8.4–10.5)
Chloride: 105 mEq/L (ref 96–112)
Glucose, Bld: 83 mg/dL (ref 70–99)
Potassium: 3.9 mEq/L (ref 3.5–5.1)
Sodium: 143 mEq/L (ref 135–145)

## 2011-03-31 LAB — HEPATIC FUNCTION PANEL
AST: 22 U/L (ref 0–37)
Albumin: 4.2 g/dL (ref 3.5–5.2)
Alkaline Phosphatase: 83 U/L (ref 39–117)
Total Protein: 7.4 g/dL (ref 6.0–8.3)

## 2011-03-31 LAB — CBC WITH DIFFERENTIAL/PLATELET
Basophils Relative: 1.2 % (ref 0.0–3.0)
Eosinophils Absolute: 0.1 10*3/uL (ref 0.0–0.7)
HCT: 40.2 % (ref 36.0–46.0)
Hemoglobin: 13.5 g/dL (ref 12.0–15.0)
Lymphocytes Relative: 43.4 % (ref 12.0–46.0)
Lymphs Abs: 2.3 10*3/uL (ref 0.7–4.0)
MCHC: 33.5 g/dL (ref 30.0–36.0)
Neutro Abs: 2.4 10*3/uL (ref 1.4–7.7)
RBC: 4.39 Mil/uL (ref 3.87–5.11)

## 2011-03-31 LAB — LIPID PANEL
HDL: 66.3 mg/dL (ref >39.00)
VLDL: 22 mg/dL (ref 0.0–40.0)

## 2011-03-31 LAB — TSH: TSH: 0.89 u[IU]/mL (ref 0.35–5.50)

## 2011-03-31 NOTE — Progress Notes (Signed)
Subjective:    Patient ID: Penny Valdez, female    DOB: [birth year], 50 y.o.   MRN: [id number]  HPI  Mrs. Dreese  is here for a physical;acute issues include intermittent "feverish" sensation & malaise, especially in afternoon. Tick exposure (5) 4 weeks ago.      Review of Systems Patient reports no vision/ hearing  changes, adenopathy, weight change,  persistant / recurrent hoarseness , swallowing issues, chest pain,palpitations,edema,persistant /recurrent cough, hemoptysis, dyspnea( rest/ exertional/paroxysmal nocturnal), gastrointestinal bleeding(melena, rectal bleeding), abdominal pain, significant heartburn,  bowel changes,GU symptoms(dysuria, hematuria,pyuria, incontinence ), Gyn symptoms(abnormal  bleeding , pain),  syncope, focal weakness, memory loss,numbness & tingling, skin/hair /nail changes,abnormal bruising or bleeding, anxiety,or depression.      Objective:   Physical Exam  Gen.: Healthy and well-nourished in appearance. Alert, appropriate and cooperative throughout exam. Head: Normocephalic without obvious abnormalities Eyes: No corneal or conjunctival inflammation noted. Pupils equal round reactive to light and accommodation. Fundal exam is benign without hemorrhages, exudate, papilledema. Extraocular motion intact. Vision grossly normal with lenses. Ears: External  ear exam reveals no significant lesions or deformities. Canals clear .TMs normal. Hearing is grossly normal bilaterally. Nose: External nasal exam reveals no deformity or inflammation. Nasal mucosa are pink and moist. No lesions or exudates noted.  Mouth: Oral mucosa and oropharynx reveal no lesions or exudates. Teeth in good repair. Neck: No deformities, masses, or tenderness noted. Range of motion &. Thyroid normal. Lungs: Normal respiratory effort; chest expands symmetrically. Lungs are clear to auscultation without rales, wheezes, or increased work of breathing. Heart: Normal rate and rhythm. Normal S1 and S2.  No gallop, click, or rub. S 4 w/o  murmur. Abdomen: Bowel sounds normal; abdomen soft and nontender. No masses, organomegaly or hernias noted. Genitalia: Dr Greta Doom   .                                                                                   Musculoskeletal/extremities: Minor  noted of  the thoracic  spine. No clubbing, cyanosis, edema, or deformity noted. Range of motion  normal .Tone & strength  normal.Joints normal. Nail health  good. Vascular: Carotid, radial artery, dorsalis pedis and  posterior tibial pulses are full and equal. No bruits present. Neurologic: Alert and oriented x3. Deep tendon reflexes symmetrical and normal.          Skin: Intact without suspicious lesions or rashes. Lymph: No cervical, axillary  lymphadenopathy present. Psych: Mood and affect are normal. Normally interactive                                                                                       Assessment & Plan:  #1 comprehensive physical exam; no acute findings #2 see Problem List with Assessments & Recommendations  #3 malaise and questionable fever in the context of tick exposure in past  month  Plan: see Orders

## 2011-03-31 NOTE — Patient Instructions (Signed)
Preventive Health Care: Exercise  30-45  minutes a day, 3-4 days a week. Walking is especially valuable in preventing Osteoporosis. Eat a low-fat diet with lots of fruits and vegetables, up to 7-9 servings per day.Consume less than 30 grams of sugar per day from foods & drinks with High Fructose Corn Syrup as # 1,2,3 or #4 on label. Health Care Power of Attorney & Living Will place you in charge of your health care  decisions. Verify these are  in place. .  

## 2011-04-03 ENCOUNTER — Other Ambulatory Visit: Payer: Self-pay | Admitting: Internal Medicine

## 2011-04-03 DIAGNOSIS — N959 Unspecified menopausal and perimenopausal disorder: Secondary | ICD-10-CM

## 2011-05-18 ENCOUNTER — Other Ambulatory Visit: Payer: Self-pay | Admitting: Gynecology

## 2011-05-18 ENCOUNTER — Ambulatory Visit (INDEPENDENT_AMBULATORY_CARE_PROVIDER_SITE_OTHER)
Admission: RE | Admit: 2011-05-18 | Discharge: 2011-05-18 | Disposition: A | Discharge status: Home / Self Care | Payer: BC Managed Care – PPO | Source: Ambulatory Visit | Attending: Internal Medicine | Admitting: Internal Medicine

## 2011-05-18 DIAGNOSIS — Z13820 Encounter for screening for osteoporosis: Secondary | ICD-10-CM

## 2011-05-18 DIAGNOSIS — N959 Unspecified menopausal and perimenopausal disorder: Secondary | ICD-10-CM

## 2011-05-25 ENCOUNTER — Encounter: Payer: Self-pay | Admitting: Internal Medicine

## 2011-05-31 ENCOUNTER — Other Ambulatory Visit: Payer: BC Managed Care – PPO

## 2011-07-18 ENCOUNTER — Other Ambulatory Visit: Payer: Self-pay | Admitting: Internal Medicine

## 2012-03-27 ENCOUNTER — Other Ambulatory Visit: Payer: Self-pay | Admitting: Internal Medicine

## 2012-04-19 ENCOUNTER — Other Ambulatory Visit: Payer: Self-pay | Admitting: Internal Medicine

## 2012-07-02 ENCOUNTER — Encounter: Payer: Self-pay | Admitting: Internal Medicine

## 2012-07-02 ENCOUNTER — Ambulatory Visit (INDEPENDENT_AMBULATORY_CARE_PROVIDER_SITE_OTHER): Payer: BC Managed Care – PPO | Admitting: Internal Medicine

## 2012-07-02 VITALS — BP 118/80 | HR 69 | Temp 97.9°F | Resp 14 | Ht 64.5 in | Wt 160.0 lb

## 2012-07-02 DIAGNOSIS — M949 Disorder of cartilage, unspecified: Secondary | ICD-10-CM

## 2012-07-02 DIAGNOSIS — M8580 Other specified disorders of bone density and structure, unspecified site: Secondary | ICD-10-CM | POA: Insufficient documentation

## 2012-07-02 DIAGNOSIS — Z0000 Encounter for general adult medical examination without abnormal findings: Secondary | ICD-10-CM

## 2012-07-02 DIAGNOSIS — M899 Disorder of bone, unspecified: Secondary | ICD-10-CM

## 2012-07-02 LAB — LDL CHOLESTEROL, DIRECT: Direct LDL: 127.1 mg/dL

## 2012-07-02 LAB — BASIC METABOLIC PANEL
CO2: 28 mEq/L (ref 19–32)
Calcium: 9.3 mg/dL (ref 8.4–10.5)
Chloride: 105 mEq/L (ref 96–112)
Glucose, Bld: 94 mg/dL (ref 70–99)
Potassium: 3.7 mEq/L (ref 3.5–5.1)
Sodium: 139 mEq/L (ref 135–145)

## 2012-07-02 LAB — CBC WITH DIFFERENTIAL/PLATELET
Basophils Absolute: 0.1 10*3/uL (ref 0.0–0.1)
Basophils Relative: 1.3 % (ref 0.0–3.0)
Eosinophils Absolute: 0.1 10*3/uL (ref 0.0–0.7)
HCT: 41.2 % (ref 36.0–46.0)
Hemoglobin: 13.9 g/dL (ref 12.0–15.0)
Lymphs Abs: 2.2 10*3/uL (ref 0.7–4.0)
MCHC: 33.8 g/dL (ref 30.0–36.0)
MCV: 89.6 fl (ref 78.0–100.0)
Monocytes Absolute: 0.4 10*3/uL (ref 0.1–1.0)
Neutro Abs: 2.7 10*3/uL (ref 1.4–7.7)
RDW: 13.4 % (ref 11.5–14.6)

## 2012-07-02 LAB — LIPID PANEL
Cholesterol: 214 mg/dL — ABNORMAL HIGH (ref 0–200)
HDL: 56.8 mg/dL (ref >39.00)
VLDL: 27 mg/dL (ref 0.0–40.0)

## 2012-07-02 LAB — HEPATIC FUNCTION PANEL
Albumin: 4.2 g/dL (ref 3.5–5.2)
Total Protein: 7.3 g/dL (ref 6.0–8.3)

## 2012-07-02 NOTE — Progress Notes (Signed)
Subjective:    Patient ID: Penny Valdez, female    DOB: Oct 03, 1960, 52 y.o.   MRN: [id number]  HPI  Penny Valdez is here for a physical;acute issues include L ear pressure & R heel pain      Review of Systems She was treated for an upper respiratory tract infection the third week in December 2013. The symptoms resolved with antbiotics; but she's had residual pressure in the left ear & head congestion. This is not associated with tinnitus, hearing loss, discharge, frontal headache, facial pain, or nasal purulence. She also denies fever, chills, or sweats.  Heel pain began 6 months ago without definite trigger or injury. It is described as sharp and intermittent. Walking makes it worse as does standing up after sitting for a while. Wearing bedroom shoes helps. There is no associated myalgias, joint stiffness/redness, or swelling.           Objective:   Physical Exam Gen.:  well-nourished in appearance. Alert, appropriate and cooperative throughout exam. Appears younger than stated age  Head: Normocephalic without obvious abnormalities  Eyes: No corneal or conjunctival inflammation noted. Pupils equal round reactive to light and accommodation. Fundal exam is benign without hemorrhages, exudate, papilledema. Extraocular motion intact. Vision grossly normal with lenses. Ears: External  ear exam reveals no significant lesions or deformities. Canals clear .TMs normal. Hearing is grossly normal bilaterally. Nose: External nasal exam reveals no deformity or inflammation. Nasal mucosa are pink and moist. No lesions or exudates noted.   Mouth: Oral mucosa and oropharynx reveal no lesions or exudates. Teeth in good repair. Neck: No deformities, masses, or tenderness noted. Range of motion slightly decreased. Thyroid normal. Lungs: Normal respiratory effort; chest expands symmetrically. Lungs are clear to auscultation without rales, wheezes, or increased work of breathing. Heart: Normal rate and  rhythm. Normal S1 and S2. No gallop, click, or rub. S 4 w/o murmur. Abdomen: Bowel sounds normal; abdomen soft and nontender. No masses, organomegaly or hernias noted. Genitalia: Dr Greta Doom, Gyn                                    Musculoskeletal/extremities: Slight accentuated curvature of upper thoracic  spine. . No clubbing, cyanosis, edema, or significant extremity  deformity noted. Range of motion normal .Tone & strength  normal.Joints normal. Nail health good. Able to lie down & sit up w/o help. Negative SLR bilaterally. There is slight tenderness of the plantar fascia at the medial anterior aspect of the calcaneus. Vascular: Carotid, radial artery, dorsalis pedis and  posterior tibial pulses are full and equal. No bruits present. Neurologic: Alert and oriented x3. Deep tendon reflexes symmetrical and normal. Skin: Intact without suspicious lesions or rashes. Lymph: No cervical, axillary lymphadenopathy present. Psych: Mood and affect are normal. Normally interactive                                                                                         Assessment & Plan:  #1 comprehensive physical exam; no acute findings #2 L Eustachian tube dysfunction #3 localized plantar fasciitis Plan: see  Orders

## 2012-07-02 NOTE — Patient Instructions (Addendum)
Preventive Health Care: Recommended lifestyle to prevent Osteoporosis include calcium 600 mg twice a day  & vitamin D3 supplementation to keep vit D  level @ least 40-60. The usual vitamin D3 dose is 1000 IU daily; but individual dose is determined by annual vitamin D level monitor. Also weight bearing exercise such as  walking 30-45 minutes 3-4  X per week is recommended.   Eat a low-fat diet with lots of fruits and vegetables, up to 7-9 servings per day.  Consume less than 30 grams of sugar per day from foods & drinks with High Fructose Corn Syrup as #1,2,3 or #4 on label. Health Care Power of Attorney & Living Will place you in charge of your health care  decisions. Verify these are  in place. Go to [facility] for eustachian tube dysfunction. Drink thin  fluids liberally through the day and chew sugarless gum . Do the Valsalva maneuver several times a day to "pop" ears open.Flonase 1 spray in L nostril twice a day as needed. Use the "crossover" technique as discussed.  Roll the affected foot over a tennis ball 20 times twice a day. After this soak the foot in warm Epsom salts for 15-20 minutes. Wear arch supports in both shoes. Podiatry referral if symptoms persist.   If you activate My Chart; the results can be released to you as soon as they populate from the lab. If you choose not to use this program; the labs have to be reviewed, copied & mailed   causing a delay in getting the results to you.

## 2012-12-09 ENCOUNTER — Telehealth: Payer: Self-pay | Admitting: *Deleted

## 2012-12-09 MED ORDER — FUROSEMIDE 20 MG PO TABS: 20.0000 mg | ORAL_TABLET | Freq: Every day | ORAL | Status: DC | PRN

## 2012-12-09 NOTE — Telephone Encounter (Signed)
Pt c/o swelling in ankle and feet x1 month. Pt denies any pain, redness or tenderness. Pt notes that she has been sitting a lot lately and her leg are swelling like they did before. Pt requesting a refill on fluid pill. Per chart Pt was given lasix 20 mg prn.Please advise ok to fill med or OV needed Pt use [facility] [place]

## 2012-12-09 NOTE — Telephone Encounter (Signed)
OK # 30; OVINB

## 2012-12-09 NOTE — Telephone Encounter (Signed)
Discuss with patient, Rx sent. 

## 2012-12-19 ENCOUNTER — Other Ambulatory Visit: Payer: Self-pay | Admitting: Internal Medicine

## 2012-12-23 ENCOUNTER — Telehealth: Payer: Self-pay | Admitting: Internal Medicine

## 2013-01-02 ENCOUNTER — Telehealth: Payer: Self-pay | Admitting: Internal Medicine

## 2013-01-03 NOTE — Telephone Encounter (Signed)
Told patient I would have to send Dr. Marina Goodell a note to see if I could refill her Prevacid without an immediate office visit.  Patient stated it may be a few months before she can come in.  I told her I would call her when I got and answer

## 2013-01-06 ENCOUNTER — Telehealth: Payer: Self-pay | Admitting: Gastroenterology

## 2013-01-06 MED ORDER — LANSOPRAZOLE 30 MG PO CPDR: 30.0000 mg | DELAYED_RELEASE_CAPSULE | Freq: Every day | ORAL | Status: DC

## 2013-01-06 NOTE — Telephone Encounter (Signed)
Message copied by Richardo Hanks on Mon Jan 06, 2013  3:32 PM ------      Message from: Hilarie Fredrickson      Created: Mon Jan 06, 2013  8:53 AM       Ok to refill for a few months. Should be seen before years end. Thanks       ----- Message -----         From: Jeanine Luz, CMA         Sent: 01/03/2013  10:21 AM           To: Hilarie Fredrickson, MD            I recently told patient she could not have anymore refills of Prevacid as she had not been seen since her last procedure in 09/2010.  Patient stated she has started a new job and cannot take any time off for the next couple of months.  Would you like me to continue giving her Prevacid and try to schedule her for a few months out or refuse?       ------

## 2013-02-28 ENCOUNTER — Telehealth: Payer: Self-pay | Admitting: Pulmonary Disease

## 2013-02-28 NOTE — Telephone Encounter (Signed)
Advised pt last OV was in 2012 so pt will need an appt before any order can be signed so they will contact the pt.  Carron Curie, CMA

## 2013-03-06 ENCOUNTER — Other Ambulatory Visit: Payer: Self-pay | Admitting: Gynecology

## 2013-03-06 LAB — HM MAMMOGRAPHY: HM Mammogram: NORMAL

## 2013-03-20 NOTE — Telephone Encounter (Signed)
Answered on 01/02/13

## 2013-03-24 ENCOUNTER — Telehealth: Payer: Self-pay | Admitting: Pulmonary Disease

## 2013-03-24 NOTE — Telephone Encounter (Signed)
I spoke with pt and made her aware to keep her appt scheduled for November scheduled with VS. She will need to discuss this with VS at her appt as she has not been seen since 2012. Nothing further needed

## 2013-04-17 ENCOUNTER — Other Ambulatory Visit: Payer: Self-pay

## 2013-04-28 ENCOUNTER — Ambulatory Visit (INDEPENDENT_AMBULATORY_CARE_PROVIDER_SITE_OTHER): Payer: PRIVATE HEALTH INSURANCE | Admitting: Pulmonary Disease

## 2013-04-28 ENCOUNTER — Encounter: Payer: Self-pay | Admitting: Pulmonary Disease

## 2013-04-28 VITALS — BP 110/76 | HR 64 | Ht 65.0 in | Wt 168.0 lb

## 2013-04-28 DIAGNOSIS — G4733 Obstructive sleep apnea (adult) (pediatric): Secondary | ICD-10-CM

## 2013-04-28 NOTE — Progress Notes (Signed)
Chief Complaint  Patient presents with  . Sleep Apnea    Has oral appliance for sleep apnea treatment but has not used for a while.    History of Present Illness: TRENIECE HOLSCLAW is a 52 y.o. female with OSA.  I last saw Ms. Pehl in 2012.  She was tried on CPAP.  She couldn't find a proper fitting mask.  She was tried on two different types of full face masks, but these both had significant leak and she had to tighten straps too much.  She was told by her DME that she needed to use full face mask because she is a "mouth breather" at night.  As a result she has not been able to use CPAP on a consistent basis.  She was told by her dentist that she could get an oral appliance for her sleep apnea, but she needed a prescription from a physician.  She continues to have trouble with snoring, and feeling tired during the day.  She feels like she is a fog during the day, and has trouble concentrating.  TESTS: PSG 07/08/10 >> AHI 21.7, CPAP 7 >> AHI 0  Christian B Mcsweeney  has a past medical history of GERD (gastroesophageal reflux disease); Cancer; Hyperlipidemia; Gilbert's syndrome; Diverticulosis of colon; Obstructive sleep apnea; History of migraines; and Depression (2009).  CIRE DEYARMIN  has past surgical history that includes Cholecystectomy; Total abdominal hysterectomy (2010); G2 P2; Colonoscopy (2012); and Esophageal dilation.  Prior to Admission medications   Medication Sig Start Date End Date Taking? Authorizing Provider  furosemide (LASIX) 20 MG tablet Take 1 tablet (20 mg total) by mouth daily as needed. 12/09/12  Yes Pecola Lawless, MD  lansoprazole (PREVACID) 30 MG capsule Take 1 capsule (30 mg total) by mouth daily. 01/06/13  Yes Hilarie Fredrickson, MD    Allergies  Allergen Reactions  . Nizatidine     REACTION: swelling with Axid  . Atorvastatin Other (See Comments)    Joint pain  . Omeprazole   . Penicillins     As child; does not remember  . Prednisone     Facial swelling from  dosepack from [facility] 2013  . Prochlorperazine Edisylate Other (See Comments)    Looses muscle control  . Esomeprazole Magnesium     Mental status changes     Physical Exam:  General - No distress ENT - No sinus tenderness, mild click of TMJ, MP 3, no oral exudate, no LAN Cardiac - s1s2 regular, no murmur Chest - No wheeze/rales/dullness Back - No focal tenderness Abd - Soft, non-tender Ext - No edema Neuro - Normal strength Skin - No rashes Psych - normal mood, and behavior   Assessment/Plan:  Coralyn Helling, MD [facility] Pager:  [phone]

## 2013-04-28 NOTE — Patient Instructions (Signed)
Will arrange for new CPAP mask Will get copy of CPAP report Follow up in 6 weeks

## 2013-04-29 NOTE — Assessment & Plan Note (Signed)
She has history of moderate sleep apnea.  She has continues snoring, apnea, sleep disruption, and daytime sleepiness.  Her main difficulty with CPAP set up has been mask fit.  I have explained that CPAP is still best option for treating sleep apnea, and that would be best to exhaust all options for fitting mask before deciding to try oral appliance.  She is agreeable to this approach.  Will have her DME refit her CPAP mask >> I explained that she does not need to use full face mask, but could instead try nasal mask or nasal pillows.  Discussed proper mask fit techniques.  Once she is refitted with her CPAP mask, will then have her DME send her CPAP download.  Explained that she may need repeat in-lab study at some point, but don't think she needs one at present.  If these measures are unsuccessful in re-establishing CPAP therapy, then will need to pursue oral appliance.

## 2013-06-24 ENCOUNTER — Ambulatory Visit: Payer: PRIVATE HEALTH INSURANCE | Admitting: Pulmonary Disease

## 2013-06-26 ENCOUNTER — Telehealth: Payer: Self-pay

## 2013-06-26 NOTE — Telephone Encounter (Signed)
Denied Lansoprazole.  Per Dr. Henrene Pastor, patient needs an office visit.

## 2013-07-07 ENCOUNTER — Encounter: Payer: Self-pay | Admitting: Internal Medicine

## 2013-07-28 ENCOUNTER — Encounter: Payer: PRIVATE HEALTH INSURANCE | Admitting: Internal Medicine

## 2013-07-30 ENCOUNTER — Encounter: Payer: Self-pay | Admitting: *Deleted

## 2013-08-01 ENCOUNTER — Ambulatory Visit (INDEPENDENT_AMBULATORY_CARE_PROVIDER_SITE_OTHER): Payer: PRIVATE HEALTH INSURANCE | Admitting: Internal Medicine

## 2013-08-01 ENCOUNTER — Encounter: Payer: Self-pay | Admitting: Internal Medicine

## 2013-08-01 ENCOUNTER — Ambulatory Visit (INDEPENDENT_AMBULATORY_CARE_PROVIDER_SITE_OTHER)
Admission: RE | Admit: 2013-08-01 | Discharge: 2013-08-01 | Disposition: A | Discharge status: Home / Self Care | Payer: PRIVATE HEALTH INSURANCE | Source: Ambulatory Visit | Attending: Internal Medicine | Admitting: Internal Medicine

## 2013-08-01 ENCOUNTER — Other Ambulatory Visit (INDEPENDENT_AMBULATORY_CARE_PROVIDER_SITE_OTHER): Payer: PRIVATE HEALTH INSURANCE

## 2013-08-01 VITALS — BP 113/78 | HR 69 | Ht 65.0 in | Wt 155.5 lb

## 2013-08-01 VITALS — BP 110/80 | HR 69 | Temp 97.0°F | Ht 64.25 in | Wt 164.1 lb

## 2013-08-01 DIAGNOSIS — Z0000 Encounter for general adult medical examination without abnormal findings: Secondary | ICD-10-CM

## 2013-08-01 DIAGNOSIS — Z86010 Personal history of colonic polyps: Secondary | ICD-10-CM

## 2013-08-01 DIAGNOSIS — K222 Esophageal obstruction: Secondary | ICD-10-CM

## 2013-08-01 DIAGNOSIS — K219 Gastro-esophageal reflux disease without esophagitis: Secondary | ICD-10-CM

## 2013-08-01 DIAGNOSIS — M8580 Other specified disorders of bone density and structure, unspecified site: Secondary | ICD-10-CM

## 2013-08-01 DIAGNOSIS — K589 Irritable bowel syndrome without diarrhea: Secondary | ICD-10-CM

## 2013-08-01 DIAGNOSIS — M899 Disorder of bone, unspecified: Secondary | ICD-10-CM

## 2013-08-01 DIAGNOSIS — M949 Disorder of cartilage, unspecified: Secondary | ICD-10-CM

## 2013-08-01 DIAGNOSIS — H8110 Benign paroxysmal vertigo, unspecified ear: Secondary | ICD-10-CM

## 2013-08-01 DIAGNOSIS — D126 Benign neoplasm of colon, unspecified: Secondary | ICD-10-CM | POA: Insufficient documentation

## 2013-08-01 LAB — CBC WITH DIFFERENTIAL/PLATELET
Basophils Absolute: 0.1 10*3/uL (ref 0.0–0.1)
Basophils Relative: 1.3 % (ref 0.0–3.0)
EOS ABS: 0.1 10*3/uL (ref 0.0–0.7)
Eosinophils Relative: 2.1 % (ref 0.0–5.0)
HCT: 44.1 % (ref 36.0–46.0)
Hemoglobin: 14.6 g/dL (ref 12.0–15.0)
Lymphocytes Relative: 46.3 % — ABNORMAL HIGH (ref 12.0–46.0)
Lymphs Abs: 3.2 10*3/uL (ref 0.7–4.0)
MCHC: 33.1 g/dL (ref 30.0–36.0)
MCV: 91.3 fl (ref 78.0–100.0)
MONO ABS: 0.6 10*3/uL (ref 0.1–1.0)
Monocytes Relative: 8.9 % (ref 3.0–12.0)
NEUTROS ABS: 2.9 10*3/uL (ref 1.4–7.7)
NEUTROS PCT: 41.4 % — AB (ref 43.0–77.0)
Platelets: 266 10*3/uL (ref 150.0–400.0)
RBC: 4.83 Mil/uL (ref 3.87–5.11)
RDW: 13.8 % (ref 11.5–14.6)
WBC: 6.9 10*3/uL (ref 4.5–10.5)

## 2013-08-01 LAB — BASIC METABOLIC PANEL
BUN: 18 mg/dL (ref 6–23)
CO2: 28 meq/L (ref 19–32)
CREATININE: 0.7 mg/dL (ref 0.4–1.2)
Calcium: 9.6 mg/dL (ref 8.4–10.5)
Chloride: 103 mEq/L (ref 96–112)
GFR: 91.22 mL/min (ref >60.00)
GLUCOSE: 85 mg/dL (ref 70–99)
Potassium: 4.1 mEq/L (ref 3.5–5.1)
Sodium: 139 mEq/L (ref 135–145)

## 2013-08-01 LAB — HEPATIC FUNCTION PANEL
ALT: 23 U/L (ref 0–35)
AST: 24 U/L (ref 0–37)
Albumin: 4.4 g/dL (ref 3.5–5.2)
Alkaline Phosphatase: 71 U/L (ref 39–117)
BILIRUBIN DIRECT: 0.1 mg/dL (ref 0.0–0.3)
TOTAL PROTEIN: 7.6 g/dL (ref 6.0–8.3)
Total Bilirubin: 1 mg/dL (ref 0.3–1.2)

## 2013-08-01 LAB — LIPID PANEL
Cholesterol: 193 mg/dL (ref 0–200)
HDL: 68.4 mg/dL (ref >39.00)
LDL Cholesterol: 113 mg/dL — ABNORMAL HIGH (ref 0–99)
TRIGLYCERIDES: 56 mg/dL (ref 0.0–149.0)
Total CHOL/HDL Ratio: 3
VLDL: 11.2 mg/dL (ref 0.0–40.0)

## 2013-08-01 LAB — TSH: TSH: 0.43 u[IU]/mL (ref 0.35–5.50)

## 2013-08-01 MED ORDER — LANSOPRAZOLE 30 MG PO CPDR: 30.0000 mg | DELAYED_RELEASE_CAPSULE | Freq: Every day | ORAL | Status: DC
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
## 2013-08-01 NOTE — Patient Instructions (Signed)
Your next office appointment will be determined based upon review of your pending labs & BMD. Those instructions will be transmitted to you through My Chart  . Followup as needed for your acute issue. Please report any significant change in your symptoms.Go to [facility] for information on benign positional vertigo (BPV) . Physical therapy exercises can treat that. Repeat the isometric exercises discussed 4- 5 times prior to standing if you've been in bed or seated for a period of time.

## 2013-08-01 NOTE — Progress Notes (Signed)
Pre visit review using our clinic review tool, if applicable. No additional management support is needed unless otherwise documented below in the visit note. 

## 2013-08-01 NOTE — Progress Notes (Signed)
   Subjective:    Patient ID: Penny Valdez, female    DOB: 03-16-1961, 53 y.o.   MRN: [id number]  HPI  She is here for a physical;acute issues vertigo symptoms.     Review of Systems Over the last several months she would intermittently have frank vertigo when she reclines. Elevating head of bed has been of some benefit. She also has similar symptoms when she tries to rise from the bed after sitting on the edge.  She does not have symptoms when she turns over in bed. She has no symptoms with extreme flexion of the neck.  There no associated findings of hearing loss, tinnitus, double vision or loss of vision.  She denies associated numbness, tingling, burning in her extremities except for intermittent burning/tingling over the dorsum of the feet during the day.     Objective:   Physical Exam Gen.: Healthy and well-nourished in appearance. Alert, appropriate and cooperative throughout exam. Appears younger than stated age  Head: Normocephalic without obvious abnormalities Eyes: No corneal or conjunctival inflammation noted. Pupils equal round reactive to light and accommodation. Extraocular motion intact. Ears: External  ear exam reveals no significant lesions or deformities. Canals clear .TMs normal. Hearing is grossly normal bilaterally. Nose: External nasal exam reveals no deformity or inflammation. Nasal mucosa are pink and moist. No lesions or exudates noted.   Mouth: Oral mucosa and oropharynx reveal no lesions or exudates. Teeth in good repair. Neck: No deformities, masses, or tenderness noted. Range of motion &. Thyroid normal. Lungs: Normal respiratory effort; chest expands symmetrically. Lungs are clear to auscultation without rales, wheezes, or increased work of breathing. Heart: Normal rate and rhythm. Normal S1 and S2. No gallop, click, or rub. S4 w/o murmur. Abdomen: Bowel sounds normal; abdomen soft and nontender. No masses, organomegaly or hernias noted.Aorta palpable ; no  AAA Genitalia: as per Gyn                                  Musculoskeletal/extremities: No deformity or scoliosis noted of  the thoracic or lumbar spine.   No clubbing, cyanosis, edema, or significant extremity  deformity noted. Range of motion normal .Tone & strength normal. Hand joints normal  Fingernail  health good. Able to lie down & sit up w/o help. Negative SLR bilaterally Vascular: Carotid, radial artery, dorsalis pedis and  posterior tibial pulses are full and equal. No bruits present. Neurologic: Alert and oriented x3. Deep tendon reflexes symmetrical and normal.  Gait normal  . Rhomberg & finger to nose       Skin: Intact without suspicious lesions or rashes. Lymph: No cervical, axillary lymphadenopathy present. Psych: Mood and affect are normal. Normally interactive                                                                                        Assessment & Plan:  #1 comprehensive physical exam; no acute findings  #2 positional vertigo as well as vertigo related to postural hypotension.  Plan: see Orders  & Recommendations

## 2013-08-01 NOTE — Patient Instructions (Signed)
We have sent the following medications to your pharmacy for you to pick up at your convenience: Prevacid 30 mg, please take one capsule by mouth once daily

## 2013-08-05 ENCOUNTER — Other Ambulatory Visit: Payer: Self-pay | Admitting: *Deleted

## 2013-08-05 LAB — VITAMIN D 1,25 DIHYDROXY
Vitamin D 1, 25 (OH)2 Total: 57 pg/mL (ref 18–72)
Vitamin D2 1, 25 (OH)2: <8 pg/mL
Vitamin D3 1, 25 (OH)2: 57 pg/mL

## 2013-09-24 ENCOUNTER — Telehealth: Payer: Self-pay | Admitting: *Deleted

## 2013-09-24 NOTE — Telephone Encounter (Signed)
Pt referral received dx/reason for referral -"pt has noticed black spots in/around vagina". Provider reviewed ppwk recommended biopsy of areas of concern prior to appt.[facility] , unable to get through to the office, rerouted call to billing department despite automated choices.  Due to inability to reach office personnel left message with Billing for a nurse to call back regarding pt referral. Called again, all personnel were out of the office call sent to voice mail, left message with voice Teacher, adult education with providers recommendations.  Called pt and discussed providers recommendations to have area of concern biopsy prior to an appt with our facility. Pt voiced understanding.

## 2013-10-02 ENCOUNTER — Other Ambulatory Visit: Payer: Self-pay | Admitting: Gynecology

## 2014-03-23 ENCOUNTER — Other Ambulatory Visit: Payer: Self-pay | Admitting: Gynecology

## 2014-03-24 LAB — CYTOLOGY - PAP

## 2014-08-03 ENCOUNTER — Other Ambulatory Visit (INDEPENDENT_AMBULATORY_CARE_PROVIDER_SITE_OTHER): Payer: PRIVATE HEALTH INSURANCE

## 2014-08-03 ENCOUNTER — Encounter: Payer: Self-pay | Admitting: Internal Medicine

## 2014-08-03 ENCOUNTER — Ambulatory Visit (INDEPENDENT_AMBULATORY_CARE_PROVIDER_SITE_OTHER): Payer: PRIVATE HEALTH INSURANCE | Admitting: Internal Medicine

## 2014-08-03 ENCOUNTER — Other Ambulatory Visit: Payer: Self-pay | Admitting: Internal Medicine

## 2014-08-03 VITALS — BP 140/88 | HR 64 | Temp 98.0°F | Resp 13 | Ht 64.0 in | Wt 170.0 lb

## 2014-08-03 DIAGNOSIS — Z0000 Encounter for general adult medical examination without abnormal findings: Secondary | ICD-10-CM

## 2014-08-03 DIAGNOSIS — R030 Elevated blood-pressure reading, without diagnosis of hypertension: Secondary | ICD-10-CM

## 2014-08-03 DIAGNOSIS — Z0189 Encounter for other specified special examinations: Secondary | ICD-10-CM

## 2014-08-03 LAB — CBC WITH DIFFERENTIAL/PLATELET
Basophils Absolute: 0.1 10*3/uL (ref 0.0–0.1)
Basophils Relative: 0.9 % (ref 0.0–3.0)
EOS ABS: 0.1 10*3/uL (ref 0.0–0.7)
Eosinophils Relative: 2.2 % (ref 0.0–5.0)
HCT: 40.9 % (ref 36.0–46.0)
HEMOGLOBIN: 13.8 g/dL (ref 12.0–15.0)
LYMPHS ABS: 2.7 10*3/uL (ref 0.7–4.0)
Lymphocytes Relative: 46.3 % — ABNORMAL HIGH (ref 12.0–46.0)
MCHC: 33.6 g/dL (ref 30.0–36.0)
MCV: 88.5 fl (ref 78.0–100.0)
MONO ABS: 0.5 10*3/uL (ref 0.1–1.0)
Monocytes Relative: 8.1 % (ref 3.0–12.0)
NEUTROS ABS: 2.5 10*3/uL (ref 1.4–7.7)
NEUTROS PCT: 42.5 % — AB (ref 43.0–77.0)
PLATELETS: 265 10*3/uL (ref 150.0–400.0)
RBC: 4.62 Mil/uL (ref 3.87–5.11)
RDW: 13.5 % (ref 11.5–15.5)
WBC: 5.9 10*3/uL (ref 4.0–10.5)

## 2014-08-03 LAB — HEPATIC FUNCTION PANEL
ALT: 24 U/L (ref 0–35)
AST: 22 U/L (ref 0–37)
Albumin: 4.3 g/dL (ref 3.5–5.2)
Alkaline Phosphatase: 90 U/L (ref 39–117)
Bilirubin, Direct: 0.2 mg/dL (ref 0.0–0.3)
TOTAL PROTEIN: 6.7 g/dL (ref 6.0–8.3)
Total Bilirubin: 1.1 mg/dL (ref 0.2–1.2)

## 2014-08-03 LAB — BASIC METABOLIC PANEL
BUN: 14 mg/dL (ref 6–23)
CALCIUM: 9.6 mg/dL (ref 8.4–10.5)
CO2: 27 mEq/L (ref 19–32)
Chloride: 105 mEq/L (ref 96–112)
Creatinine, Ser: 0.66 mg/dL (ref 0.40–1.20)
GFR: 98.86 mL/min (ref >60.00)
Glucose, Bld: 91 mg/dL (ref 70–99)
POTASSIUM: 4.1 meq/L (ref 3.5–5.1)
SODIUM: 141 meq/L (ref 135–145)

## 2014-08-03 LAB — URINALYSIS
Bilirubin Urine: NEGATIVE
HGB URINE DIPSTICK: NEGATIVE
KETONES UR: NEGATIVE
Leukocytes, UA: NEGATIVE
Nitrite: NEGATIVE
Specific Gravity, Urine: 1.01 (ref 1.000–1.030)
TOTAL PROTEIN, URINE-UPE24: NEGATIVE
Urine Glucose: NEGATIVE
Urobilinogen, UA: 0.2 (ref 0.0–1.0)
pH: 6 (ref 5.0–8.0)

## 2014-08-03 LAB — VITAMIN D 25 HYDROXY (VIT D DEFICIENCY, FRACTURES): VITD: 10.23 ng/mL — ABNORMAL LOW (ref 30.00–100.00)

## 2014-08-03 LAB — TSH: TSH: 0.63 u[IU]/mL (ref 0.35–4.50)

## 2014-08-03 MED ORDER — FUROSEMIDE 20 MG PO TABS: 20.0000 mg | ORAL_TABLET | Freq: Every day | ORAL | Status: DC | PRN

## 2014-08-03 NOTE — Patient Instructions (Signed)
  Your next office appointment will be determined based upon review of your pending labs Those instructions will be transmitted to you through My Chart Critical values will be called. Followup as needed for any active or acute issue. Please report any significant change in your symptoms.  Minimal Blood Pressure Goal= AVERAGE < 140/90;  Ideal is an AVERAGE < 135/85. This AVERAGE should be calculated from @ least 5-7 BP readings taken @ different times of day on different days of week. You should not respond to isolated BP readings , but rather the AVERAGE for that week .Please bring your  blood pressure cuff to office visits to verify that it is reliable.It  can also be checked against the blood pressure device at the pharmacy. Finger or wrist cuffs are not dependable; an arm cuff is.

## 2014-08-03 NOTE — Progress Notes (Signed)
Subjective:    Patient ID: Penny Valdez, female    DOB: 05/18/1961, 54 y.o.   MRN: [id number]  HPI She is here for a physical;acute issues are described below.  She is on a heart healthy diet; she's not engaged in exercise program. She previously would climb the stairs at work.  Blood pressure checked at work has been in the range of 130/85. Rarely she's had some elevation the diastolic at doctor visits.  She has changed to nasal CPAP in the last 6 months. Her husband states she does snore loudly; no apneas reported.  She has switched to over-the-counter Prevacid 15 mg with control of her reflux except for some hoarseness.  Bone density is up-to-date; she has stable osteopenia. She takes vitamin D and calcium irregularly.  Edema @ work after sitting for extended period.  Review of Systems  Chest pain, palpitations, tachycardia, exertional dyspnea, paroxysmal nocturnal dyspnea,or claudication  are absent.  LLE weakness since lunging @ exercise 6 mos ago.Avoiding exercise since as "leg goes out on me"  She describes malodorous urine for 3-4 months. She has no dysuria pyuria, hematuria.  Intermittently she describes some numbness and tingling in her fingertips. 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  Unexplained weight loss, abdominal pain, significant dyspepsia, dysphagia, melena, rectal bleeding, or persistently small caliber stools are denied.     Objective:   Physical Exam Gen.: Adequately nourished in appearance. Alert, appropriate and cooperative throughout exam. BMI: 29.17 Appears younger than stated age  Head: Normocephalic without obvious abnormalities  Eyes: No corneal or conjunctival inflammation noted. Pupils equal round reactive to light and accommodation. Extraocular motion intact.  Ears: External  ear exam reveals no significant lesions or deformities. Canals clear .TMs normal. Hearing is grossly normal bilaterally. Nose: External nasal exam  reveals no deformity or inflammation. Nasal mucosa are pink and moist. No lesions or exudates noted.   Mouth: Oral mucosa and oropharynx reveal no lesions or exudates. Teeth in good repair. Neck: No deformities, masses, or tenderness noted. Range of motion & Thyroid normal. Lungs: Normal respiratory effort; chest expands symmetrically. Lungs are clear to auscultation without rales, wheezes, or increased work of breathing. Heart: Normal rate and rhythm. Normal S1 and S2. No gallop, click, or rub. No  Murmur. Initial BP 152/90; repeat BP 140/88. Abdomen: Bowel sounds normal; abdomen soft and nontender. No masses, organomegaly or hernias noted. Genitalia:  as per Gyn                                  Musculoskeletal/extremities: No deformity or scoliosis noted of  the thoracic or lumbar spine.  No clubbing, cyanosis, edema, or significant extremity  deformity noted.  Range of motion normal . Tone & strength normal. Hand joints normal Fingernail  health good. Popping  of knees  Able to lie down & sit up w/o help.  Negative SLR bilaterally Vascular: Carotid, radial artery, dorsalis pedis and  posterior tibial pulses are full and equal. No bruits present. Neurologic: Alert and oriented x3. Deep tendon reflexes symmetrical and normal.  Gait normal   Skin: Intact without suspicious lesions or rashes. Lymph: No cervical, axillary lymphadenopathy present. Psych: Mood and affect are normal. Normally interactive  Assessment & Plan:  #1 comprehensive physical exam; no acute findings  Plan: see Orders  & Recommendations

## 2014-08-03 NOTE — Progress Notes (Signed)
Pre visit review using our clinic review tool, if applicable. No additional management support is needed unless otherwise documented below in the visit note. 

## 2014-08-05 LAB — NMR LIPOPROFILE WITH LIPIDS
CHOLESTEROL, TOTAL: 235 mg/dL — AB (ref 100–199)
HDL PARTICLE NUMBER: 39.6 umol/L (ref >=30.5)
HDL Size: 9.6 nm (ref >=9.2)
HDL-C: 79 mg/dL (ref >39)
LARGE VLDL-P: 1 nmol/L (ref <=2.7)
LDL (calc): 137 mg/dL — ABNORMAL HIGH (ref 0–99)
LDL PARTICLE NUMBER: 1695 nmol/L — AB (ref <1000)
LDL Size: 21.7 nm (ref >=20.8)
LP-IR Score: <25 (ref <=45)
Large HDL-P: 12.1 umol/L (ref >=4.8)
SMALL LDL PARTICLE NUMBER: 366 nmol/L (ref <=527)
TRIGLYCERIDES: 95 mg/dL (ref 0–149)
VLDL Size: 41.8 nm (ref <=46.6)

## 2014-08-06 ENCOUNTER — Other Ambulatory Visit: Payer: Self-pay | Admitting: Internal Medicine

## 2014-08-06 DIAGNOSIS — E559 Vitamin D deficiency, unspecified: Secondary | ICD-10-CM

## 2014-08-06 HISTORY — DX: Vitamin D deficiency, unspecified: E55.9

## 2014-10-07 ENCOUNTER — Other Ambulatory Visit: Payer: Self-pay | Admitting: Internal Medicine

## 2014-11-23 ENCOUNTER — Encounter: Payer: Self-pay | Admitting: Internal Medicine

## 2015-01-05 ENCOUNTER — Ambulatory Visit: Payer: Self-pay | Admitting: Internal Medicine

## 2015-01-07 ENCOUNTER — Other Ambulatory Visit (INDEPENDENT_AMBULATORY_CARE_PROVIDER_SITE_OTHER): Payer: 59

## 2015-01-07 ENCOUNTER — Ambulatory Visit (INDEPENDENT_AMBULATORY_CARE_PROVIDER_SITE_OTHER)
Admission: RE | Admit: 2015-01-07 | Discharge: 2015-01-07 | Disposition: A | Discharge status: Home / Self Care | Payer: 59 | Source: Ambulatory Visit | Attending: Internal Medicine | Admitting: Internal Medicine

## 2015-01-07 ENCOUNTER — Encounter: Payer: Self-pay | Admitting: Internal Medicine

## 2015-01-07 ENCOUNTER — Ambulatory Visit (INDEPENDENT_AMBULATORY_CARE_PROVIDER_SITE_OTHER): Payer: 59 | Admitting: Internal Medicine

## 2015-01-07 VITALS — BP 144/92 | HR 60 | Temp 98.0°F | Resp 14 | Wt 169.0 lb

## 2015-01-07 DIAGNOSIS — M791 Myalgia: Secondary | ICD-10-CM

## 2015-01-07 DIAGNOSIS — R030 Elevated blood-pressure reading, without diagnosis of hypertension: Secondary | ICD-10-CM

## 2015-01-07 DIAGNOSIS — M609 Myositis, unspecified: Secondary | ICD-10-CM

## 2015-01-07 DIAGNOSIS — IMO0001 Reserved for inherently not codable concepts without codable children: Secondary | ICD-10-CM

## 2015-01-07 DIAGNOSIS — R609 Edema, unspecified: Secondary | ICD-10-CM | POA: Diagnosis not present

## 2015-01-07 DIAGNOSIS — R0789 Other chest pain: Secondary | ICD-10-CM

## 2015-01-07 DIAGNOSIS — E559 Vitamin D deficiency, unspecified: Secondary | ICD-10-CM | POA: Diagnosis not present

## 2015-01-07 DIAGNOSIS — H109 Unspecified conjunctivitis: Secondary | ICD-10-CM | POA: Diagnosis not present

## 2015-01-07 LAB — HEPATIC FUNCTION PANEL
ALT: 22 U/L (ref 0–35)
AST: 19 U/L (ref 0–37)
Albumin: 4.3 g/dL (ref 3.5–5.2)
Alkaline Phosphatase: 95 U/L (ref 39–117)
Bilirubin, Direct: 0.2 mg/dL (ref 0.0–0.3)
Total Bilirubin: 1.2 mg/dL (ref 0.2–1.2)
Total Protein: 7.1 g/dL (ref 6.0–8.3)

## 2015-01-07 LAB — BASIC METABOLIC PANEL
BUN: 14 mg/dL (ref 6–23)
CALCIUM: 9.7 mg/dL (ref 8.4–10.5)
CO2: 31 mEq/L (ref 19–32)
Chloride: 102 mEq/L (ref 96–112)
Creatinine, Ser: 0.74 mg/dL (ref 0.40–1.20)
GFR: 86.5 mL/min (ref >60.00)
GLUCOSE: 88 mg/dL (ref 70–99)
Potassium: 3.9 mEq/L (ref 3.5–5.1)
Sodium: 140 mEq/L (ref 135–145)

## 2015-01-07 LAB — TSH: TSH: 0.67 u[IU]/mL (ref 0.35–4.50)

## 2015-01-07 LAB — VITAMIN D 25 HYDROXY (VIT D DEFICIENCY, FRACTURES): VITD: 27.24 ng/mL — ABNORMAL LOW (ref 30.00–100.00)

## 2015-01-07 LAB — MAGNESIUM: Magnesium: 2.3 mg/dL (ref 1.5–2.5)

## 2015-01-07 MED ORDER — SPIRONOLACTONE 25 MG PO TABS: ORAL_TABLET | ORAL | Status: DC

## 2015-01-07 MED ORDER — ERYTHROMYCIN 5 MG/GM OP OINT: TOPICAL_OINTMENT | OPHTHALMIC | Status: DC

## 2015-01-07 NOTE — Patient Instructions (Addendum)
Wear over the calf support hose if you are driving or sitting for prolonged periods of  time .  Avoid ingestion of  excess salt/sodium.Cook with pepper & other spices . Use the salt substitute "No Salt" OR the Mrs Deliah Boston products to season food @ the table. Avoid foods which taste salty or "vinegary" as their sodium content will be high.

## 2015-01-07 NOTE — Progress Notes (Signed)
Pre visit review using our clinic review tool, if applicable. No additional management support is needed unless otherwise documented below in the visit note. 

## 2015-01-07 NOTE — Progress Notes (Signed)
   Subjective:    Patient ID: Penny Valdez, female    DOB: 02-07-1961, 54 y.o.   MRN: [id number]  HPI She describes edema of the lower extremities for over a year. It is worse in the Summer and better on the weekends. 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  She's taking furosemide but this causes cramps in her feet and legs.  On 01/02/15 she had tightness across her chest and back which lasted several hours. This is a rare reoccurring event.It is non exertional.  This morning she awoke with left eye red with "gunk". There were no associated vision changes. She had no associated upper respiratory tract infection symptoms.  Review of Systems  Palpitations, tachycardia, exertional dyspnea, paroxysmal nocturnal dyspnea, or claudication are absent. There is no significant cough, sputum production,hemoptysis, or wheezing. Unexplained weight loss, abdominal pain, significant dyspepsia, dysphagia, melena, rectal bleeding, or persistently small caliber stools are not present. Dysuria, pyuria, hematuria, frequency, nocturia or polyuria are denied.    Objective:   Physical Exam  Pertinent or positive findings include: There is minimal erythema of the conjunctiva of the left eye. She has wax in the right otic canal. She has no neck vein distention at 10. She has no hepato jugular reflux. She has lipedema of the ankles. Homans sign is negative.Affect is flat, clinically almost "la belle indifference" as she describes the chest pain.  General appearance :adequately nourished; in no distress.  Eyes: No scleral icterus is present.  Oral exam:  Lips and gums are healthy appearing.There is no oropharyngeal erythema or exudate noted. Dental hygiene is good.  Heart:  Normal rate and regular rhythm. S1 and S2 normal without gallop, murmur, click, rub or other extra sounds    Lungs:Chest clear to auscultation; no wheezes, rhonchi,rales ,or rubs present.No increased  work of breathing.   Abdomen: bowel sounds normal, soft and non-tender without masses, organomegaly or hernias noted.  No guarding or rebound.   Vascular : all pulses equal ; no bruits present.  Skin:Warm & dry.  Intact without suspicious lesions or rashes ; no tenting or jaundice   Lymphatic: No lymphadenopathy is noted about the head, neck, axilla  Neuro: Strength, tone & DTRs normal.       Assessment & Plan:  #1 edema    #2 muscle cramps with the use of furosemide  #3 atypical chest pain  #4 mild left conjunctivitis  See orders and plan

## 2015-01-08 LAB — D-DIMER, QUANTITATIVE: D-Dimer, Quant: 0.35 ug/mL-FEU (ref 0.00–0.48)

## 2015-01-11 DIAGNOSIS — E041 Nontoxic single thyroid nodule: Secondary | ICD-10-CM

## 2015-01-11 HISTORY — DX: Nontoxic single thyroid nodule: E04.1

## 2015-02-25 ENCOUNTER — Encounter: Payer: Self-pay | Admitting: Internal Medicine

## 2015-02-25 ENCOUNTER — Ambulatory Visit (INDEPENDENT_AMBULATORY_CARE_PROVIDER_SITE_OTHER): Payer: 59 | Admitting: Internal Medicine

## 2015-02-25 VITALS — BP 132/90 | HR 64 | Temp 98.3°F | Resp 16 | Wt 172.0 lb

## 2015-02-25 DIAGNOSIS — R946 Abnormal results of thyroid function studies: Secondary | ICD-10-CM | POA: Diagnosis not present

## 2015-02-25 NOTE — Progress Notes (Signed)
   Subjective:    Patient ID: Penny Valdez, female    DOB: [birth year], 54 y.o.   MRN: [id number]  HPI  She had a health screen 02/16/15 @ a [facility]. Carotid artery study was normal but there was suggestion of a possible "suspected abnormality of thyroid". TSH was 1.0. It had been 0.67 in July @ our lab.  She has no personal history or family history of thyroid disease.  Other than ongoing weight gain which she attributes to "because I keep eating" and cold intolerance she has no significant symptoms  She is not having cramping on spironolactone but continues to have ankle edema. She drives over 2 hours a day.   Review of Systems Pertinent negative or absent signs and symptoms are as follows: Constitutional: No significant  fatigue; sleep disorder; change in appetite. Eye: no blurred, double ,loss of vision Cardiovascular: no palpitations; racing; irregularity ENT/GI: no constipation; diarrhea;hoarseness;dysphagia Derm: no change in nails,hair,skin Neuro: no numbness or tingling; tremor Psych:no anxiety; depression; panic attacks Endo: no temperature intolerance to heat     Objective:   Physical Exam  Gen.:  Adequately nourished; in no acute distress Eyes: Extraocular motion intact; no lid lag , proptosis , or nystagmus Neck: full ROM; no masses ; thyroid normal  Heart: Normal rhythm and rate without significant murmur, gallop, or extra heart sounds Lungs: Chest clear to auscultation without rales,rales, wheezes Neuro:Deep tendon reflexes are equal and within normal limits; no tremor  Skin/Nails: Warm and dry without significant lesions or rashes; no onycholysis. Venous pattern is visible in the lower extremities over the feet dorsally. She has 1/2+ pitting edema of the feet. Lymphatic: no cervical or axillary LA Psych: Normally communicative and interactive; no abnormal mood or affect clinically.       Assessment & Plan:  #1 possible thyroid abnormality on carotid  Doppler  Plan: Designated thyroid ultrasound

## 2015-02-25 NOTE — Patient Instructions (Signed)
The Thyroid Ultrasound will be scheduled and you'll be notified of the time.Please call the Referral Co-Ordinator @ [phone] if you have not been notified of appointment time within 7-10 days.

## 2015-02-25 NOTE — Progress Notes (Signed)
Pre visit review using our clinic review tool, if applicable. No additional management support is needed unless otherwise documented below in the visit note. 

## 2015-03-12 ENCOUNTER — Ambulatory Visit
Admission: RE | Admit: 2015-03-12 | Discharge: 2015-03-12 | Disposition: A | Discharge status: Home / Self Care | Payer: 59 | Source: Ambulatory Visit | Attending: Internal Medicine | Admitting: Internal Medicine

## 2015-03-12 DIAGNOSIS — R946 Abnormal results of thyroid function studies: Secondary | ICD-10-CM

## 2015-03-23 ENCOUNTER — Telehealth: Payer: Self-pay | Admitting: Internal Medicine

## 2015-03-23 NOTE — Telephone Encounter (Signed)
Pt called in said that [facility] was going to refer her to Endo but i do not see a referral in the system?  Was she suppose to be sent to Endo?

## 2015-03-24 ENCOUNTER — Encounter: Payer: Self-pay | Admitting: Internal Medicine

## 2015-03-24 ENCOUNTER — Other Ambulatory Visit: Payer: Self-pay | Admitting: Internal Medicine

## 2015-03-24 DIAGNOSIS — E041 Nontoxic single thyroid nodule: Secondary | ICD-10-CM

## 2015-03-24 NOTE — Telephone Encounter (Signed)
Please advise 

## 2015-03-24 NOTE — Telephone Encounter (Signed)
LVM informing pt

## 2015-03-24 NOTE — Telephone Encounter (Signed)
done

## 2015-04-02 ENCOUNTER — Encounter: Payer: Self-pay | Admitting: Endocrinology

## 2015-04-02 ENCOUNTER — Ambulatory Visit (INDEPENDENT_AMBULATORY_CARE_PROVIDER_SITE_OTHER): Payer: 59 | Admitting: Endocrinology

## 2015-04-02 VITALS — BP 126/88 | HR 65 | Temp 97.8°F | Ht 64.0 in | Wt 170.0 lb

## 2015-04-02 DIAGNOSIS — E041 Nontoxic single thyroid nodule: Secondary | ICD-10-CM
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
## 2015-04-02 NOTE — Patient Instructions (Addendum)
Let's check a nuclear medicine scan.  you will receive a phone call, about a day and time for an appointment.   If the nodules are overworking, no treatment is needed now--all you need to do is to come back for a follow-up appointment in 6 months.   It is very unlikely that the hoarseness is thyroid-related.  However, if it persists, please call, so I can refer you to an ENT specialist.

## 2015-04-03 ENCOUNTER — Other Ambulatory Visit: Payer: Self-pay | Admitting: Internal Medicine

## 2015-04-22 ENCOUNTER — Telehealth: Payer: Self-pay | Admitting: Endocrinology

## 2015-04-22 NOTE — Telephone Encounter (Signed)
Patient called stating that she has not received an appointment for her nuclear test?  Please advise patient    Thank you

## 2015-04-22 NOTE — Telephone Encounter (Signed)
Pt has been scheduled for 11/16 and 11/17. [facility] contacted her to schedule at 3:13 today.

## 2015-04-29 LAB — HM PAP SMEAR: HM Pap smear: NEGATIVE

## 2015-05-05 LAB — HM PAP SMEAR

## 2015-05-10 NOTE — Telephone Encounter (Signed)
Patient calling for the results of her nuclear scan.

## 2015-05-11 NOTE — Telephone Encounter (Signed)
As expected, the right-sided nodules are overworking. No treatment is needed now. Please come back for a follow-up appointment in 6 months.

## 2015-05-11 NOTE — Telephone Encounter (Signed)
See note below. Report placed on your desk to review.

## 2015-05-11 NOTE — Telephone Encounter (Signed)
I contacted the pt and left a voicemail advising of note below.

## 2015-08-27 ENCOUNTER — Encounter: Payer: Self-pay | Admitting: Internal Medicine

## 2015-10-01 ENCOUNTER — Other Ambulatory Visit: Payer: Self-pay

## 2015-10-01 ENCOUNTER — Ambulatory Visit (INDEPENDENT_AMBULATORY_CARE_PROVIDER_SITE_OTHER): Payer: BLUE CROSS/BLUE SHIELD | Admitting: Endocrinology

## 2015-10-01 ENCOUNTER — Encounter: Payer: Self-pay | Admitting: Endocrinology

## 2015-10-01 VITALS — BP 126/72 | HR 74 | Temp 97.8°F | Ht 64.0 in | Wt 172.0 lb

## 2015-10-01 DIAGNOSIS — E041 Nontoxic single thyroid nodule: Secondary | ICD-10-CM | POA: Diagnosis not present
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
## 2015-10-01 NOTE — Patient Instructions (Addendum)
blood tests are requested for you today.  We'll let you know about the results. If it is normal, no treatment is needed now.   Please come back for a follow-up appointment in 6 months most of the time, a "lumpy thyroid" will eventually become overactive.  this is usually a slow process, happening over the span of many years.

## 2015-10-02 LAB — TSH: TSH: 0.775 u[IU]/mL (ref 0.450–4.500)

## 2015-10-04 ENCOUNTER — Encounter: Payer: Self-pay | Admitting: Internal Medicine

## 2015-10-07 ENCOUNTER — Ambulatory Visit (AMBULATORY_SURGERY_CENTER): Payer: Self-pay | Admitting: *Deleted

## 2015-10-07 VITALS — Ht 64.5 in | Wt 172.0 lb

## 2015-10-07 DIAGNOSIS — Z86010 Personal history of colonic polyps: Secondary | ICD-10-CM

## 2015-10-07 MED ORDER — NA SULFATE-K SULFATE-MG SULF 17.5-3.13-1.6 GM/177ML PO SOLN: 1.0000 | Freq: Once | ORAL | Status: DC

## 2015-10-07 NOTE — Progress Notes (Signed)
No egg or soy allergy known to patient  No issues with past sedation with any surgeries  or procedures, no intubation problems  No diet pills per patient No home 02 use per patient  No blood thinners per patient  Pt denies issues with constipation   

## 2015-10-13 ENCOUNTER — Encounter: Payer: Self-pay | Admitting: Internal Medicine

## 2015-10-15 ENCOUNTER — Telehealth: Payer: Self-pay | Admitting: Internal Medicine

## 2015-10-15 NOTE — Telephone Encounter (Signed)
Called home number pt not there , husband gave me cell. Called pt and she states prep is $100.00 with insurance and coupon and she cannot afford. Sample suprep given. Pt instructed to pick up 4th floor desk next week when she can as her procedure is 5-15 Monday   Samples of this drug were given to the patient, quantity 1 suprep , Lot Number [id number] exp 3-19  Marijean Niemann

## 2015-10-25 ENCOUNTER — Encounter: Payer: Self-pay | Admitting: Internal Medicine

## 2015-10-25 ENCOUNTER — Ambulatory Visit (AMBULATORY_SURGERY_CENTER): Payer: BLUE CROSS/BLUE SHIELD | Admitting: Internal Medicine

## 2015-10-25 VITALS — BP 155/74 | HR 66 | Temp 99.1°F | Resp 11 | Ht 64.0 in | Wt 172.0 lb

## 2015-10-25 DIAGNOSIS — Z86010 Personal history of colonic polyps: Secondary | ICD-10-CM

## 2015-10-25 MED ORDER — SODIUM CHLORIDE 0.9 % IV SOLN: 500.0000 mL | INTRAVENOUS | Status: DC

## 2015-10-25 NOTE — Patient Instructions (Signed)
YOU HAD AN ENDOSCOPIC PROCEDURE TODAY AT [facility]:   Refer to the procedure report that was given to you for any specific questions about what was found during the examination.  If the procedure report does not answer your questions, please call your gastroenterologist to clarify.  If you requested that your care partner not be given the details of your procedure findings, then the procedure report has been included in a sealed envelope for you to review at your convenience later.  YOU SHOULD EXPECT: Some feelings of bloating in the abdomen. Passage of more gas than usual.  Walking can help get rid of the air that was put into your GI tract during the procedure and reduce the bloating. If you had a lower endoscopy (such as a colonoscopy or flexible sigmoidoscopy) you may notice spotting of blood in your stool or on the toilet paper. If you underwent a bowel prep for your procedure, you may not have a normal bowel movement for a few days.  Please Note:  You might notice some irritation and congestion in your nose or some drainage.  This is from the oxygen used during your procedure.  There is no need for concern and it should clear up in a day or so.  SYMPTOMS TO REPORT IMMEDIATELY:   Following lower endoscopy (colonoscopy or flexible sigmoidoscopy):  Excessive amounts of blood in the stool  Significant tenderness or worsening of abdominal pains  Swelling of the abdomen that is new, acute  Fever of 100F or higher  For urgent or emergent issues, a gastroenterologist can be reached at any hour by calling [phone].   DIET: Your first meal following the procedure should be a small meal and then it is ok to progress to your normal diet. Heavy or fried foods are harder to digest and may make you feel nauseous or bloated.  Likewise, meals heavy in dairy and vegetables can increase bloating.  Drink plenty of fluids but you should avoid alcoholic beverages for 24  hours.  ACTIVITY:  You should plan to take it easy for the rest of today and you should NOT DRIVE or use heavy machinery until tomorrow (because of the sedation medicines used during the test).    FOLLOW UP: Our staff will call the number listed on your records the next business day following your procedure to check on you and address any questions or concerns that you may have regarding the information given to you following your procedure. If we do not reach you, we will leave a message.  However, if you are feeling well and you are not experiencing any problems, there is no need to return our call.  We will assume that you have returned to your regular daily activities without incident.  If any biopsies were taken you will be contacted by phone or by letter within the next 1-3 weeks.  Please call us at [phone] if you have not heard about the biopsies in 3 weeks.    SIGNATURES/CONFIDENTIALITY: You and/or your care partner have signed paperwork which will be entered into your electronic medical record.  These signatures attest to the fact that that the information above on your After Visit Summary has been reviewed and is understood.  Full responsibility of the confidentiality of this discharge information lies with you and/or your care-partner.  Please review diverticulosis and high fiber diet handouts provided. Next colonoscopy in 10 years.

## 2015-10-25 NOTE — Op Note (Signed)
[facility] Patient Name: Penny Valdez Procedure Date: 10/25/2015 3:28 PM MRN: [id number] Endoscopist: Docia Chuck. Henrene Pastor , MD Age: 55 Referring MD:  Date of Birth: 10/28/1960 Gender: Female Procedure:                Colonoscopy Indications:              Surveillance: Personal history of adenomatous                            polyps on last colonoscopy 5 years ago (April                            2012), High risk colon cancer surveillance:                            Personal history of non-advanced adenoma Medicines:                Monitored Anesthesia Care Procedure:                Pre-Anesthesia Assessment:                           - Prior to the procedure, a History and Physical                            was performed, and patient medications and                            allergies were reviewed. The patient's tolerance of                            previous anesthesia was also reviewed. The risks                            and benefits of the procedure and the sedation                            options and risks were discussed with the patient.                            All questions were answered, and informed consent                            was obtained. Prior Anticoagulants: The patient has                            taken no previous anticoagulant or antiplatelet                            agents. ASA Grade Assessment: II - A patient with                            mild systemic disease. After reviewing the risks  and benefits, the patient was deemed in                            satisfactory condition to undergo the procedure.                           After obtaining informed consent, the colonoscope                            was passed under direct vision. Throughout the                            procedure, the patient's blood pressure, pulse, and                            oxygen saturations were monitored continuously. The                      Model CF-[id number]) scope was introduced                            through the anus and advanced to the the cecum,                            identified by the appendiceal orifice, ileocecal                            valve and palpation. The ileocecal valve,                            appendiceal orifice, and rectum were photographed.                            The quality of the bowel preparation was excellent.                            The colonoscopy was performed without difficulty.                            The patient tolerated the procedure well. The bowel                            preparation used was SUPREP. Normal digital rectal                            examination. Scope In: 3:40:49 PM Scope Out: 3:51:49 PM Scope Withdrawal Time: 0 hours 8 minutes 37 seconds  Total Procedure Duration: 0 hours 11 minutes 0 seconds  Findings:                 A few small-mouthed diverticula were found in the                            sigmoid colon.  The exam was otherwise without abnormality on                            direct and retroflexion views. Complications:            No immediate complications. Estimated blood loss:                            None. Estimated Blood Loss:     Estimated blood loss: none. Impression:               - Diverticulosis in the sigmoid colon.                           - The examination was otherwise normal on direct                            and retroflexion views.                           - No specimens collected. Recommendation:           - Repeat colonoscopy in 10 years for surveillance. Docia Chuck. Henrene Pastor, MD 10/25/2015 3:58:39 PM This report has been signed electronically.

## 2015-10-25 NOTE — Progress Notes (Signed)
Report given to PACU RN, vss 

## 2015-10-26 ENCOUNTER — Telehealth: Payer: Self-pay | Admitting: *Deleted

## 2015-10-26 NOTE — Telephone Encounter (Signed)
Message left

## 2016-01-13 ENCOUNTER — Telehealth: Payer: Self-pay | Admitting: Endocrinology

## 2016-01-13 NOTE — Telephone Encounter (Signed)
Patient would like to talk to Dr Loanne Drilling about her thyroid, she have decided to take the radiation pill. Can she do it in one day or do she need to make an appointment  please advise

## 2016-01-14 NOTE — Telephone Encounter (Signed)
I contacted the pt and advised via vm Dr. Loanne Drilling is out of town until 01/24/2016. Pt was advised to call back to schedule an appt to discuss the RAI.

## 2016-01-19 NOTE — Telephone Encounter (Signed)
PT calling to find out if she has the radioactive treatment, can she be around people?

## 2016-01-20 NOTE — Telephone Encounter (Signed)
FYI I contacted the pt and advised she will need to be in isolation for 3 days after the RAI. Pt stated due to this information she will be moving her Radioactive Iodine treatment appointment dates because her daughter is pregnant and could have her baby at any time and she wants to be present for that. Pt stated she will call the nuc med scheduling department once she is ready to proceed with the RAI.

## 2016-02-07 NOTE — Telephone Encounter (Signed)
PT called and said she does want to go ahead with the RAI.  I told her I would let you know so the referral could go ahead and be placed.

## 2016-02-07 NOTE — Telephone Encounter (Signed)
See message to be advised.  

## 2016-02-07 NOTE — Telephone Encounter (Signed)
I called pt today.  As TSH is normal now, no treatment ins needed now.  I'll see you next time.

## 2016-02-08 ENCOUNTER — Ambulatory Visit: Payer: BLUE CROSS/BLUE SHIELD | Admitting: Endocrinology

## 2016-02-19 ENCOUNTER — Other Ambulatory Visit: Payer: Self-pay | Admitting: Internal Medicine

## 2016-02-25 ENCOUNTER — Ambulatory Visit (INDEPENDENT_AMBULATORY_CARE_PROVIDER_SITE_OTHER): Payer: BLUE CROSS/BLUE SHIELD | Admitting: Internal Medicine

## 2016-02-25 ENCOUNTER — Encounter: Payer: Self-pay | Admitting: Internal Medicine

## 2016-02-25 ENCOUNTER — Other Ambulatory Visit (INDEPENDENT_AMBULATORY_CARE_PROVIDER_SITE_OTHER): Payer: BLUE CROSS/BLUE SHIELD

## 2016-02-25 VITALS — BP 134/86 | HR 63 | Temp 97.8°F | Resp 16 | Wt 176.0 lb

## 2016-02-25 DIAGNOSIS — Z114 Encounter for screening for human immunodeficiency virus [HIV]: Secondary | ICD-10-CM | POA: Diagnosis not present

## 2016-02-25 DIAGNOSIS — R51 Headache: Secondary | ICD-10-CM

## 2016-02-25 DIAGNOSIS — R519 Headache, unspecified: Secondary | ICD-10-CM

## 2016-02-25 DIAGNOSIS — Z1159 Encounter for screening for other viral diseases: Secondary | ICD-10-CM

## 2016-02-25 DIAGNOSIS — R1011 Right upper quadrant pain: Secondary | ICD-10-CM | POA: Insufficient documentation

## 2016-02-25 DIAGNOSIS — R17 Unspecified jaundice: Secondary | ICD-10-CM

## 2016-02-25 DIAGNOSIS — K219 Gastro-esophageal reflux disease without esophagitis: Secondary | ICD-10-CM

## 2016-02-25 DIAGNOSIS — R600 Localized edema: Secondary | ICD-10-CM

## 2016-02-25 HISTORY — DX: Headache, unspecified: R51.9

## 2016-02-25 LAB — CBC WITH DIFFERENTIAL/PLATELET
BASOS ABS: 0.1 10*3/uL (ref 0.0–0.1)
Basophils Relative: 0.7 % (ref 0.0–3.0)
EOS ABS: 0.2 10*3/uL (ref 0.0–0.7)
EOS PCT: 2.3 % (ref 0.0–5.0)
HCT: 39.5 % (ref 36.0–46.0)
HEMOGLOBIN: 13.4 g/dL (ref 12.0–15.0)
LYMPHS ABS: 3.2 10*3/uL (ref 0.7–4.0)
Lymphocytes Relative: 41.4 % (ref 12.0–46.0)
MCHC: 33.8 g/dL (ref 30.0–36.0)
MCV: 88.6 fl (ref 78.0–100.0)
MONO ABS: 0.6 10*3/uL (ref 0.1–1.0)
Monocytes Relative: 7.7 % (ref 3.0–12.0)
NEUTROS PCT: 47.9 % (ref 43.0–77.0)
Neutro Abs: 3.6 10*3/uL (ref 1.4–7.7)
Platelets: 276 10*3/uL (ref 150.0–400.0)
RBC: 4.46 Mil/uL (ref 3.87–5.11)
RDW: 13.9 % (ref 11.5–15.5)
WBC: 7.6 10*3/uL (ref 4.0–10.5)

## 2016-02-25 LAB — COMPREHENSIVE METABOLIC PANEL
ALBUMIN: 4.2 g/dL (ref 3.5–5.2)
ALK PHOS: 85 U/L (ref 39–117)
ALT: 24 U/L (ref 0–35)
AST: 19 U/L (ref 0–37)
BILIRUBIN TOTAL: 1.1 mg/dL (ref 0.2–1.2)
BUN: 13 mg/dL (ref 6–23)
CO2: 31 mEq/L (ref 19–32)
CREATININE: 0.7 mg/dL (ref 0.40–1.20)
Calcium: 9.2 mg/dL (ref 8.4–10.5)
Chloride: 104 mEq/L (ref 96–112)
GFR: 91.85 mL/min (ref >60.00)
GLUCOSE: 85 mg/dL (ref 70–99)
Potassium: 3.7 mEq/L (ref 3.5–5.1)
SODIUM: 143 meq/L (ref 135–145)
TOTAL PROTEIN: 7 g/dL (ref 6.0–8.3)

## 2016-02-25 LAB — SEDIMENTATION RATE: SED RATE: 5 mm/h (ref 0–30)

## 2016-02-25 LAB — C-REACTIVE PROTEIN: CRP: 0.6 mg/dL (ref 0.5–20.0)

## 2016-02-25 NOTE — Progress Notes (Signed)
Pre visit review using our clinic review tool, if applicable. No additional management support is needed unless otherwise documented below in the visit note. 
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
## 2016-02-25 NOTE — Patient Instructions (Signed)
  Test(s) ordered today. Your results will be released to [place] (or called to you) after review, usually within 72hours after test completion. If any changes need to be made, you will be notified at that same time.  All other Health Maintenance issues reviewed.   All recommended immunizations and age-appropriate screenings are up-to-date or discussed.  No immunizations administered today.   Medications reviewed and updated.  No changes recommended at this time.   An ultrasound of your abdomen was ordered  Please followup if your symptoms do not improve

## 2016-02-26 ENCOUNTER — Encounter: Payer: Self-pay | Admitting: Internal Medicine

## 2016-02-26 DIAGNOSIS — R600 Localized edema: Secondary | ICD-10-CM

## 2016-02-26 HISTORY — DX: Localized edema: R60.0

## 2016-02-26 LAB — HEPATITIS C ANTIBODY: HCV AB: NEGATIVE

## 2016-02-26 LAB — HIV ANTIBODY (ROUTINE TESTING W REFLEX): HIV: NONREACTIVE

## 2016-02-26 NOTE — Assessment & Plan Note (Signed)
Following with GI GERD controlled Continue daily medication  

## 2016-02-26 NOTE — Assessment & Plan Note (Signed)
Headaches present for months - intermittent, 1-2 times a week Tenderness on right temple, no vessel tenderness or muscle pain/weakness Unlikely CGA but will check esr, crp Pain relieved with Excedrin migraine May be stress related

## 2016-02-26 NOTE — Assessment & Plan Note (Signed)
Months of tenderness of RUQ Mildly tender on exam  Will check [place] S/p GB removal

## 2016-02-26 NOTE — Assessment & Plan Note (Signed)
Chronic, intermittent ankle edema Takes spironolactone prn - 1-2 times a week Continue same

## 2016-02-26 NOTE — Assessment & Plan Note (Signed)
She has noticed increased icterus recently - she thinks it may be related to stress - it has been more persistent Check cmp

## 2016-02-26 NOTE — Assessment & Plan Note (Signed)
Chronic Intermittent mild icterus at times

## 2016-03-17 ENCOUNTER — Ambulatory Visit
Admission: RE | Admit: 2016-03-17 | Discharge: 2016-03-17 | Disposition: A | Discharge status: Home / Self Care | Payer: BLUE CROSS/BLUE SHIELD | Source: Ambulatory Visit | Attending: Internal Medicine | Admitting: Internal Medicine

## 2016-03-17 ENCOUNTER — Telehealth: Payer: Self-pay | Admitting: Emergency Medicine

## 2016-03-17 DIAGNOSIS — R1011 Right upper quadrant pain: Secondary | ICD-10-CM

## 2016-03-17 NOTE — Telephone Encounter (Signed)
Spoke with [employer].

## 2016-03-17 NOTE — Telephone Encounter (Signed)
Pt is at [facility] now. They need somebody to look at the order with her. They want to make sure the right test is being done. Please advise thanks.

## 2016-03-19 ENCOUNTER — Encounter: Payer: Self-pay | Admitting: Internal Medicine

## 2016-03-31 ENCOUNTER — Ambulatory Visit: Payer: 59 | Admitting: Endocrinology

## 2016-04-21 ENCOUNTER — Ambulatory Visit (INDEPENDENT_AMBULATORY_CARE_PROVIDER_SITE_OTHER): Payer: BLUE CROSS/BLUE SHIELD | Admitting: Endocrinology

## 2016-04-21 ENCOUNTER — Encounter: Payer: Self-pay | Admitting: Endocrinology

## 2016-04-21 VITALS — BP 140/90 | HR 72 | Wt 176.6 lb

## 2016-04-21 DIAGNOSIS — E041 Nontoxic single thyroid nodule: Secondary | ICD-10-CM | POA: Diagnosis not present

## 2016-04-21 LAB — TSH: TSH: 1.25 u[IU]/mL (ref 0.35–4.50)
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
## 2016-04-21 NOTE — Patient Instructions (Addendum)
A thyroid blood test is requested for you today.  We'll let you know about the results. If it is normal, no treatment is needed now.   Please come back for a follow-up appointment in 1 year.   most of the time, a "lumpy thyroid" will eventually become overactive.  this is usually a slow process, happening over the span of many years.  When this happens, the radioactive iodine pill will help.  You can even take that if the blood test is normal--just let us know.

## 2016-06-11 ENCOUNTER — Telehealth: Payer: BLUE CROSS/BLUE SHIELD | Admitting: Nurse Practitioner

## 2016-06-11 DIAGNOSIS — J0101 Acute recurrent maxillary sinusitis: Secondary | ICD-10-CM

## 2016-06-11 MED ORDER — DOXYCYCLINE HYCLATE 100 MG PO TABS: 100.0000 mg | ORAL_TABLET | Freq: Two times a day (BID) | ORAL | 0 refills | Status: DC

## 2016-06-11 NOTE — Progress Notes (Signed)

## 2016-06-17 IMAGING — CR DG CHEST 2V
2 series · 2 of 2 positions shown · non-contrast
Comparison: None.

CLINICAL DATA: Chest pain radiates posteriorly, peripheral edema
for 1 year

EXAM:
CHEST  2 VIEW

[view not recorded (1 of 2)]
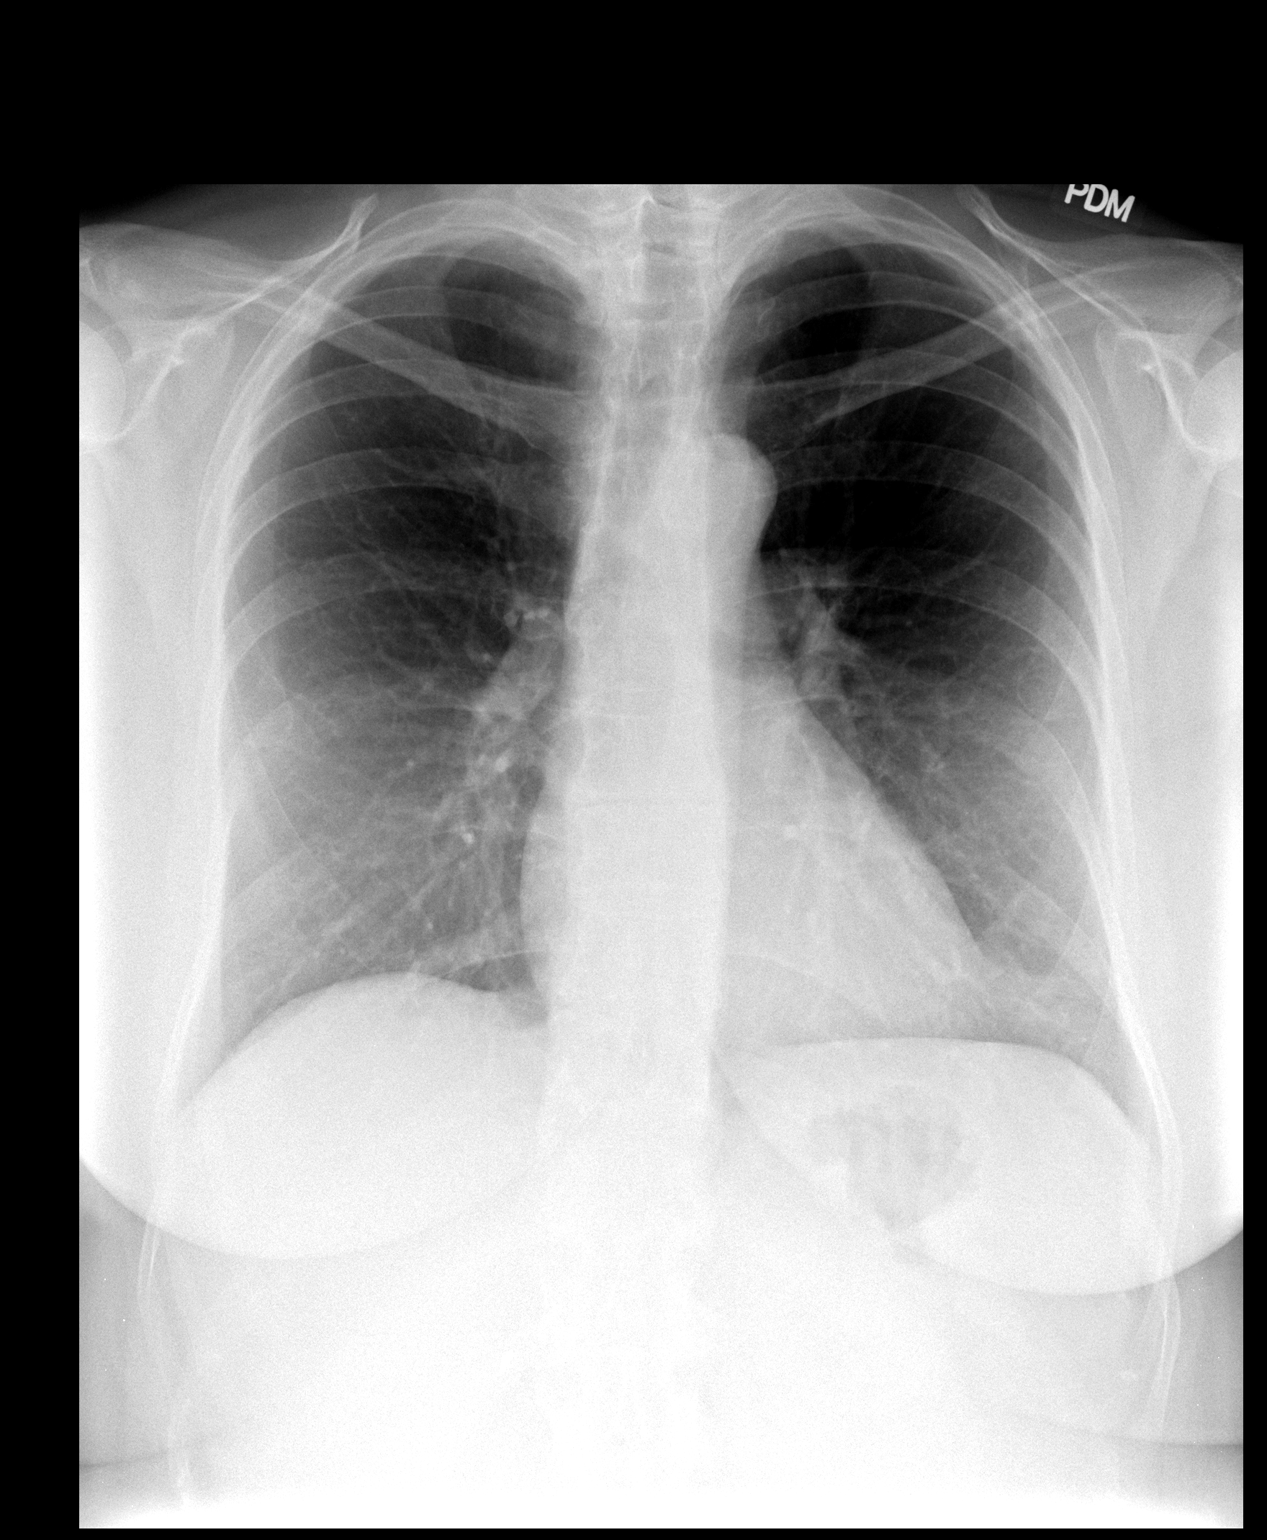

[view not recorded (2 of 2)]
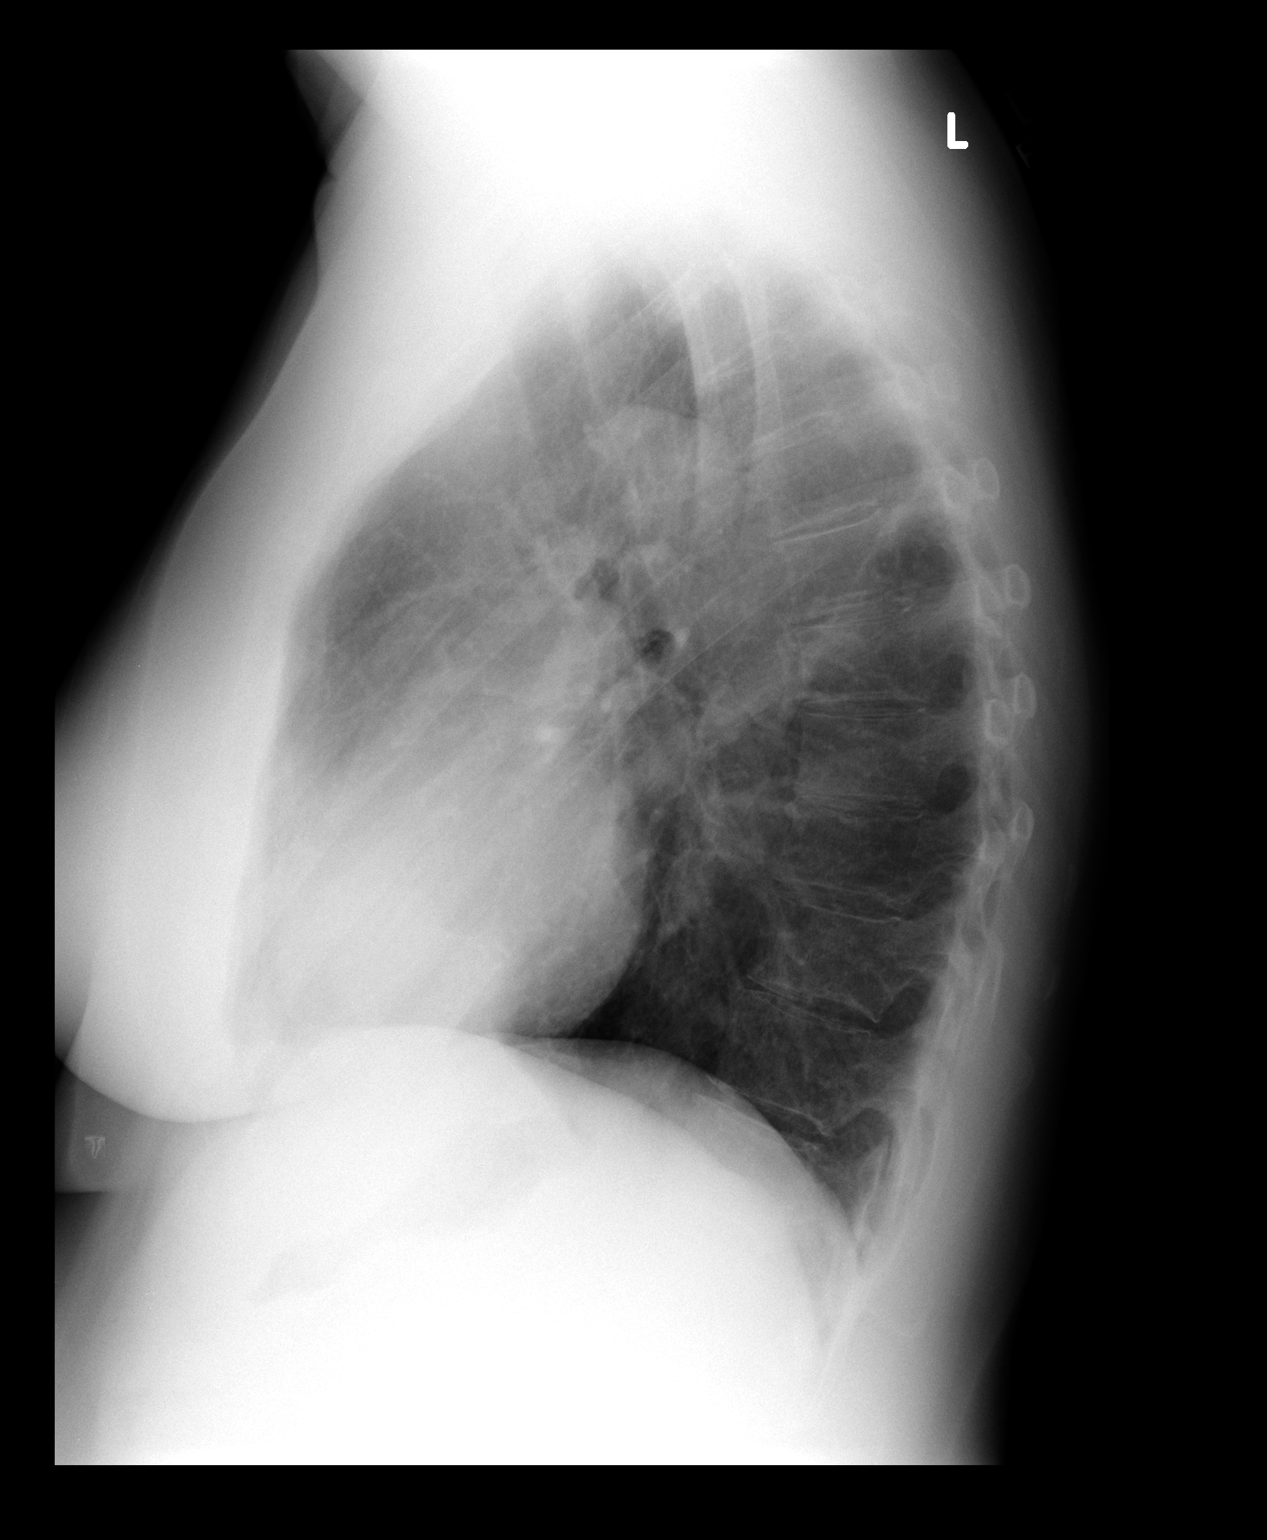

[2 of 2 positions shown; findings below may reference images not displayed]

FINDINGS: The heart size and mediastinal contours are within normal limits.
Both lungs are clear. The visualized skeletal structures are
unremarkable.
IMPRESSION: No active cardiopulmonary disease.

## 2016-07-07 LAB — HM MAMMOGRAPHY

## 2016-08-18 ENCOUNTER — Other Ambulatory Visit: Payer: Self-pay | Admitting: Internal Medicine

## 2017-04-20 ENCOUNTER — Encounter: Payer: Self-pay | Admitting: Endocrinology

## 2017-04-20 ENCOUNTER — Other Ambulatory Visit (HOSPITAL_COMMUNITY)
Admission: RE | Admit: 2017-04-20 | Discharge: 2017-04-20 | Disposition: A | Discharge status: Home / Self Care | Payer: 59 | Source: Ambulatory Visit | Attending: Endocrinology | Admitting: Endocrinology

## 2017-04-20 ENCOUNTER — Ambulatory Visit (INDEPENDENT_AMBULATORY_CARE_PROVIDER_SITE_OTHER): Payer: 59 | Admitting: Endocrinology

## 2017-04-20 VITALS — BP 140/90 | HR 69 | Wt 173.6 lb

## 2017-04-20 DIAGNOSIS — E041 Nontoxic single thyroid nodule: Secondary | ICD-10-CM
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
## 2017-04-20 NOTE — Patient Instructions (Signed)
blood tests are requested for you today.  We'll let you know about the results of the blood and the biopsy. Please come back for a follow-up appointment in 1 year.

## 2017-04-30 DIAGNOSIS — M1710 Unilateral primary osteoarthritis, unspecified knee: Secondary | ICD-10-CM

## 2017-04-30 DIAGNOSIS — K219 Gastro-esophageal reflux disease without esophagitis: Secondary | ICD-10-CM

## 2017-04-30 DIAGNOSIS — M67441 Ganglion, right hand: Secondary | ICD-10-CM

## 2017-04-30 DIAGNOSIS — Z8639 Personal history of other endocrine, nutritional and metabolic disease: Secondary | ICD-10-CM

## 2017-04-30 HISTORY — DX: Personal history of other endocrine, nutritional and metabolic disease: Z86.39

## 2017-04-30 HISTORY — DX: Gastro-esophageal reflux disease without esophagitis: K21.9

## 2017-04-30 HISTORY — DX: Ganglion, right hand: M67.441

## 2017-04-30 HISTORY — DX: Unilateral primary osteoarthritis, unspecified knee: M17.10

## 2017-05-14 ENCOUNTER — Encounter: Payer: Self-pay | Admitting: Internal Medicine

## 2017-05-29 ENCOUNTER — Telehealth: Payer: 59 | Admitting: Family

## 2017-05-29 DIAGNOSIS — B9689 Other specified bacterial agents as the cause of diseases classified elsewhere: Secondary | ICD-10-CM

## 2017-05-29 DIAGNOSIS — J019 Acute sinusitis, unspecified: Secondary | ICD-10-CM

## 2017-05-29 MED ORDER — DOXYCYCLINE HYCLATE 100 MG PO TABS: 100.0000 mg | ORAL_TABLET | Freq: Two times a day (BID) | ORAL | 0 refills | Status: DC

## 2017-05-29 NOTE — Progress Notes (Signed)

## 2017-08-03 DIAGNOSIS — L570 Actinic keratosis: Secondary | ICD-10-CM

## 2017-08-03 HISTORY — DX: Actinic keratosis: L57.0

## 2017-08-30 ENCOUNTER — Encounter: Payer: Self-pay | Admitting: Internal Medicine

## 2018-05-24 DIAGNOSIS — R609 Edema, unspecified: Secondary | ICD-10-CM

## 2018-05-24 DIAGNOSIS — R252 Cramp and spasm: Secondary | ICD-10-CM | POA: Insufficient documentation

## 2018-05-24 HISTORY — DX: Cramp and spasm: R25.2

## 2018-05-24 HISTORY — DX: Edema, unspecified: R60.9

## 2018-09-30 ENCOUNTER — Encounter: Payer: Self-pay | Admitting: Physician Assistant

## 2018-09-30 ENCOUNTER — Telehealth: Payer: 59 | Admitting: Physician Assistant

## 2018-09-30 DIAGNOSIS — H5710 Ocular pain, unspecified eye: Secondary | ICD-10-CM

## 2018-09-30 DIAGNOSIS — G4489 Other headache syndrome: Secondary | ICD-10-CM

## 2018-09-30 DIAGNOSIS — R509 Fever, unspecified: Secondary | ICD-10-CM

## 2018-09-30 NOTE — Progress Notes (Signed)
Based on what you shared with me, I feel your condition warrants further evaluation and I recommend that you be seen for a face to face office visit.  As per our conversation You have stated that you have a headache around your eye that is causing pain with eye movement, especially to the left side. It is recommended that you have a face to face evaluation for further workup of your symptoms.    NOTE: If you entered your credit card information for this eVisit, you will not be charged. You may see a "hold" on your card for the $35 but that hold will drop off and you will not have a charge processed.  If you are having a true medical emergency please call 911.  If you need an urgent face to face visit, [place] has four urgent care centers for your convenience.    PLEASE NOTE: THE [facility] LOCATIONS AND URGENT CARE CLINICS DO NOT HAVE THE TESTING FOR CORONAVIRUS COVID19 AVAILABLE.  IF YOU FEEL YOU NEED THIS TEST YOU MUST GO TO A TRIAGE LOCATION AT [place]   [email or web address] to reserve your spot online an avoid wait times  [facility] [address], [address] [place], [place] [address] Modified hours of operation: Monday-Friday, 10 AM to 6 PM  Saturday & Sunday 10 AM to 4 PM *Across the street from [place] (New Address!) [address] [place], [place] [address] *Just off [address], across the road from [place] hours of operation: Monday-Friday, 10 AM to 5 PM  Closed Saturday & Sunday   The following sites will take your insurance:  . [facility] [place] a Provider at this Location  [address] [place], [place] [address] . 10 am to 8 pm Monday-Friday . 12 pm to 8 pm Saturday-Sunday   . [facility] at [place] a Provider at  this Location  [address], [place], [place] [address] . 8 am to 8 pm Monday-Friday . 9 am to 6 pm Saturday . 11 am to 6 pm Sunday   . [facility] at [place] Get Driving Directions  [address] [place], [place] [address] . 8 am to 8 pm Monday-Friday . 8 am to 4 pm Saturday-Sunday   Your e-visit answers were reviewed by a board certified advanced clinical practitioner to complete your personal care plan.  Thank you for using e-Visits. I have spent 7 min in completion and review of this note- Lacy Duverney [facility]

## 2018-10-04 DIAGNOSIS — R112 Nausea with vomiting, unspecified: Secondary | ICD-10-CM

## 2018-10-04 HISTORY — DX: Nausea with vomiting, unspecified: R11.2

## 2018-10-22 DIAGNOSIS — H109 Unspecified conjunctivitis: Secondary | ICD-10-CM | POA: Insufficient documentation

## 2018-10-22 HISTORY — DX: Unspecified conjunctivitis: H10.9
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
## 2018-11-19 ENCOUNTER — Ambulatory Visit (INDEPENDENT_AMBULATORY_CARE_PROVIDER_SITE_OTHER): Payer: 59 | Admitting: Internal Medicine

## 2018-11-19 ENCOUNTER — Encounter: Payer: Self-pay | Admitting: Internal Medicine

## 2018-11-19 VITALS — BP 146/94 | HR 70 | Temp 98.2°F | Resp 16 | Ht 64.0 in | Wt 168.0 lb

## 2018-11-19 DIAGNOSIS — R519 Headache, unspecified: Secondary | ICD-10-CM

## 2018-11-19 DIAGNOSIS — R030 Elevated blood-pressure reading, without diagnosis of hypertension: Secondary | ICD-10-CM | POA: Diagnosis not present

## 2018-11-19 DIAGNOSIS — H5789 Other specified disorders of eye and adnexa: Secondary | ICD-10-CM

## 2018-11-19 DIAGNOSIS — R51 Headache: Secondary | ICD-10-CM

## 2018-11-19 HISTORY — DX: Headache, unspecified: R51.9

## 2018-11-19 HISTORY — DX: Other specified disorders of eye and adnexa: H57.89

## 2018-11-19 NOTE — Assessment & Plan Note (Signed)
Blood pressure elevated here today and has been elevated in the past Discussed that she may need medication for blood pressure-she prefers to monitor for now Monitor blood pressure Discussed this could be the cause of her dull headache

## 2018-11-19 NOTE — Assessment & Plan Note (Signed)
Headache is dull and frontal This may be related to her slightly elevated blood pressure-she has not been diagnosed with hypertension and is not on any medication She would prefer not to start medication at this time Will monitor blood pressure If her blood pressure is persistently elevated he should be started on medication Her headache may also be related to inflammation in her eyes-she will discuss this with the ophthalmologist

## 2018-11-19 NOTE — Assessment & Plan Note (Signed)
Redness bilateral eyes-for 2 weeks after she contracted COVID-19 Treated with tobramycin, prednisone eyedrops, oral prednisone at different times Has persistent redness Discussed this could be something like scleritis or some other type of inflammation Advised that she needs to see an ophthalmologist-names and numbers given

## 2018-11-19 NOTE — Patient Instructions (Addendum)
Dr Katy Fitch       [phone] - [place]  Dr Bing Plume   [phone]   [place] [facility] [place]   [phone]

## 2019-01-06 ENCOUNTER — Other Ambulatory Visit: Payer: Self-pay | Admitting: Obstetrics

## 2019-01-06 DIAGNOSIS — R928 Other abnormal and inconclusive findings on diagnostic imaging of breast: Secondary | ICD-10-CM

## 2019-01-10 ENCOUNTER — Ambulatory Visit
Admission: RE | Admit: 2019-01-10 | Discharge: 2019-01-10 | Disposition: A | Discharge status: Home / Self Care | Payer: 59 | Source: Ambulatory Visit | Attending: Obstetrics | Admitting: Obstetrics

## 2019-01-10 ENCOUNTER — Other Ambulatory Visit: Payer: Self-pay

## 2019-01-10 DIAGNOSIS — R928 Other abnormal and inconclusive findings on diagnostic imaging of breast: Secondary | ICD-10-CM
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
## 2019-02-03 NOTE — Patient Instructions (Addendum)
Goal BP is less than 140/90.   Tests ordered today. Your results will be released to [place] (or called to you) after review.  If any changes need to be made, you will be notified at that same time.  All other Health Maintenance issues reviewed.   All recommended immunizations and age-appropriate screenings are up-to-date or discussed.  No immunization administered today.   Medications reviewed and updated.  Changes include :   Start taking the spironolactone 12.5 mg daily.    Your prescription(s) have been submitted to your pharmacy. Please take as directed and contact our office if you believe you are having problem(s) with the medication(s).   Please followup in 6 months   Health Maintenance, Female Adopting a healthy lifestyle and getting preventive care are important in promoting health and wellness. Ask your health care provider about:  The right schedule for you to have regular tests and exams.  Things you can do on your own to prevent diseases and keep yourself healthy. What should I know about diet, weight, and exercise? Eat a healthy diet   Eat a diet that includes plenty of vegetables, fruits, low-fat dairy products, and lean protein.  Do not eat a lot of foods that are high in solid fats, added sugars, or sodium. Maintain a healthy weight Body mass index (BMI) is used to identify weight problems. It estimates body fat based on height and weight. Your health care provider can help determine your BMI and help you achieve or maintain a healthy weight. Get regular exercise Get regular exercise. This is one of the most important things you can do for your health. Most adults should:  Exercise for at least 150 minutes each week. The exercise should increase your heart rate and make you sweat (moderate-intensity exercise).  Do strengthening exercises at least twice a week. This is in addition to the moderate-intensity exercise.  Spend less time sitting. Even light physical  activity can be beneficial. Watch cholesterol and blood lipids Have your blood tested for lipids and cholesterol at 59 years of age, then have this test every 5 years. Have your cholesterol levels checked more often if:  Your lipid or cholesterol levels are high.  You are older than 59 years of age.  You are at high risk for heart disease. What should I know about cancer screening? Depending on your health history and family history, you may need to have cancer screening at various ages. This may include screening for:  Breast cancer.  Cervical cancer.  Colorectal cancer.  Skin cancer.  Lung cancer. What should I know about heart disease, diabetes, and high blood pressure? Blood pressure and heart disease  High blood pressure causes heart disease and increases the risk of stroke. This is more likely to develop in people who have high blood pressure readings, are of African descent, or are overweight.  Have your blood pressure checked: ? Every 3-5 years if you are 67-87 years of age. ? Every year if you are 82 years old or older. Diabetes Have regular diabetes screenings. This checks your fasting blood sugar level. Have the screening done:  Once every three years after age 45 if you are at a normal weight and have a low risk for diabetes.  More often and at a younger age if you are overweight or have a high risk for diabetes. What should I know about preventing infection? Hepatitis B If you have a higher risk for hepatitis B, you should be screened for this  virus. Talk with your health care provider to find out if you are at risk for hepatitis B infection. Hepatitis C Testing is recommended for:  Everyone born from [birth year] through 1966.  Anyone with known risk factors for hepatitis C. Sexually transmitted infections (STIs)  Get screened for STIs, including gonorrhea and chlamydia, if: ? You are sexually active and are younger than 59 years of age. ? You are older than 59  years of age and your health care provider tells you that you are at risk for this type of infection. ? Your sexual activity has changed since you were last screened, and you are at increased risk for chlamydia or gonorrhea. Ask your health care provider if you are at risk.  Ask your health care provider about whether you are at high risk for HIV. Your health care provider may recommend a prescription medicine to help prevent HIV infection. If you choose to take medicine to prevent HIV, you should first get tested for HIV. You should then be tested every 3 months for as long as you are taking the medicine. Pregnancy  If you are about to stop having your period (premenopausal) and you may become pregnant, seek counseling before you get pregnant.  Take 400 to 800 micrograms (mcg) of folic acid every day if you become pregnant.  Ask for birth control (contraception) if you want to prevent pregnancy. Osteoporosis and menopause Osteoporosis is a disease in which the bones lose minerals and strength with aging. This can result in bone fractures. If you are 22 years old or older, or if you are at risk for osteoporosis and fractures, ask your health care provider if you should:  Be screened for bone loss.  Take a calcium or vitamin D supplement to lower your risk of fractures.  Be given hormone replacement therapy (HRT) to treat symptoms of menopause. Follow these instructions at home: Lifestyle  Do not use any products that contain nicotine or tobacco, such as cigarettes, e-cigarettes, and chewing tobacco. If you need help quitting, ask your health care provider.  Do not use street drugs.  Do not share needles.  Ask your health care provider for help if you need support or information about quitting drugs. Alcohol use  Do not drink alcohol if: ? Your health care provider tells you not to drink. ? You are pregnant, may be pregnant, or are planning to become pregnant.  If you drink alcohol:  ? Limit how much you use to 0-1 drink a day. ? Limit intake if you are breastfeeding.  Be aware of how much alcohol is in your drink. In the [place] one drink equals one 12 oz bottle of beer (355 mL), one 5 oz glass of wine (148 mL), or one 1 oz glass of hard liquor (44 mL). General instructions  Schedule regular health, dental, and eye exams.  Stay current with your vaccines.  Tell your health care provider if: ? You often feel depressed. ? You have ever been abused or do not feel safe at home. Summary  Adopting a healthy lifestyle and getting preventive care are important in promoting health and wellness.  Follow your health care provider's instructions about healthy diet, exercising, and getting tested or screened for diseases.  Follow your health care provider's instructions on monitoring your cholesterol and blood pressure. This information is not intended to replace advice given to you by your health care provider. Make sure you discuss any questions you have with your health care provider. Document  Released: 12/12/2010 Document Revised: 05/22/2018 Document Reviewed: 05/22/2018 Elsevier Patient Education  2020 [employer].

## 2019-02-04 ENCOUNTER — Other Ambulatory Visit: Payer: Self-pay

## 2019-02-04 ENCOUNTER — Encounter: Payer: Self-pay | Admitting: Internal Medicine

## 2019-02-04 ENCOUNTER — Ambulatory Visit (INDEPENDENT_AMBULATORY_CARE_PROVIDER_SITE_OTHER): Payer: 59 | Admitting: Internal Medicine

## 2019-02-04 ENCOUNTER — Other Ambulatory Visit (INDEPENDENT_AMBULATORY_CARE_PROVIDER_SITE_OTHER): Payer: 59

## 2019-02-04 VITALS — BP 152/98 | HR 71 | Temp 98.0°F | Resp 16 | Ht 64.0 in | Wt 174.1 lb

## 2019-02-04 DIAGNOSIS — E559 Vitamin D deficiency, unspecified: Secondary | ICD-10-CM

## 2019-02-04 DIAGNOSIS — R519 Headache, unspecified: Secondary | ICD-10-CM

## 2019-02-04 DIAGNOSIS — R5383 Other fatigue: Secondary | ICD-10-CM

## 2019-02-04 DIAGNOSIS — R51 Headache: Secondary | ICD-10-CM

## 2019-02-04 DIAGNOSIS — R600 Localized edema: Secondary | ICD-10-CM

## 2019-02-04 DIAGNOSIS — Z0000 Encounter for general adult medical examination without abnormal findings: Secondary | ICD-10-CM | POA: Diagnosis not present

## 2019-02-04 DIAGNOSIS — I10 Essential (primary) hypertension: Secondary | ICD-10-CM

## 2019-02-04 DIAGNOSIS — K219 Gastro-esophageal reflux disease without esophagitis: Secondary | ICD-10-CM

## 2019-02-04 DIAGNOSIS — R609 Edema, unspecified: Secondary | ICD-10-CM | POA: Insufficient documentation

## 2019-02-04 DIAGNOSIS — E782 Mixed hyperlipidemia: Secondary | ICD-10-CM

## 2019-02-04 DIAGNOSIS — G4733 Obstructive sleep apnea (adult) (pediatric): Secondary | ICD-10-CM

## 2019-02-04 DIAGNOSIS — R030 Elevated blood-pressure reading, without diagnosis of hypertension: Secondary | ICD-10-CM

## 2019-02-04 HISTORY — DX: Headache, unspecified: R51.9

## 2019-02-04 HISTORY — DX: Essential (primary) hypertension: I10

## 2019-02-04 HISTORY — DX: Other fatigue: R53.83

## 2019-02-04 LAB — COMPREHENSIVE METABOLIC PANEL
ALT: 27 U/L (ref 0–35)
AST: 22 U/L (ref 0–37)
Albumin: 4.6 g/dL (ref 3.5–5.2)
Alkaline Phosphatase: 69 U/L (ref 39–117)
BUN: 17 mg/dL (ref 6–23)
CO2: 27 mEq/L (ref 19–32)
Calcium: 9.8 mg/dL (ref 8.4–10.5)
Chloride: 103 mEq/L (ref 96–112)
Creatinine, Ser: 0.76 mg/dL (ref 0.40–1.20)
GFR: 77.79 mL/min (ref >60.00)
Glucose, Bld: 98 mg/dL (ref 70–99)
Potassium: 4.2 mEq/L (ref 3.5–5.1)
Sodium: 140 mEq/L (ref 135–145)
Total Bilirubin: 0.9 mg/dL (ref 0.2–1.2)
Total Protein: 7.1 g/dL (ref 6.0–8.3)

## 2019-02-04 LAB — CBC WITH DIFFERENTIAL/PLATELET
Basophils Absolute: 0.1 10*3/uL (ref 0.0–0.1)
Basophils Relative: 2.2 % (ref 0.0–3.0)
Eosinophils Absolute: 0.3 10*3/uL (ref 0.0–0.7)
Eosinophils Relative: 5 % (ref 0.0–5.0)
HCT: 42 % (ref 36.0–46.0)
Hemoglobin: 14.1 g/dL (ref 12.0–15.0)
Lymphocytes Relative: 41.2 % (ref 12.0–46.0)
Lymphs Abs: 2.6 10*3/uL (ref 0.7–4.0)
MCHC: 33.6 g/dL (ref 30.0–36.0)
MCV: 90.8 fl (ref 78.0–100.0)
Monocytes Absolute: 0.5 10*3/uL (ref 0.1–1.0)
Monocytes Relative: 8.6 % (ref 3.0–12.0)
Neutro Abs: 2.7 10*3/uL (ref 1.4–7.7)
Neutrophils Relative %: 43 % (ref 43.0–77.0)
Platelets: 253 10*3/uL (ref 150.0–400.0)
RBC: 4.63 Mil/uL (ref 3.87–5.11)
RDW: 13.6 % (ref 11.5–15.5)
WBC: 6.4 10*3/uL (ref 4.0–10.5)

## 2019-02-04 LAB — LIPID PANEL
Cholesterol: 253 mg/dL — ABNORMAL HIGH (ref 0–200)
HDL: 77.3 mg/dL (ref >39.00)
LDL Cholesterol: 151 mg/dL — ABNORMAL HIGH (ref 0–99)
NonHDL: 175.4
Total CHOL/HDL Ratio: 3
Triglycerides: 122 mg/dL (ref 0.0–149.0)
VLDL: 24.4 mg/dL (ref 0.0–40.0)

## 2019-02-04 LAB — TSH: TSH: 0.84 u[IU]/mL (ref 0.35–4.50)

## 2019-02-04 LAB — VITAMIN D 25 HYDROXY (VIT D DEFICIENCY, FRACTURES): VITD: 22.88 ng/mL — ABNORMAL LOW (ref 30.00–100.00)

## 2019-02-04 LAB — SEDIMENTATION RATE: Sed Rate: 10 mm/hr (ref 0–30)

## 2019-02-04 MED ORDER — CHOLECALCIFEROL 50 MCG (2000 UT) PO TABS: 2000.0000 [IU] | ORAL_TABLET | Freq: Every day | ORAL | Status: DC

## 2019-02-04 MED ORDER — SPIRONOLACTONE 25 MG PO TABS: 12.5000 mg | ORAL_TABLET | Freq: Every day | ORAL | 1 refills | Status: DC

## 2019-02-04 NOTE — Assessment & Plan Note (Signed)
Taking vitamin d  Will check level 

## 2019-02-04 NOTE — Assessment & Plan Note (Signed)
Check lipid

## 2019-02-04 NOTE — Assessment & Plan Note (Signed)
Post covid fatigue ? OSA contributing Check labs encouraged following up with Dr Halford Chessman Start regular exercise Can start MVI Check labs

## 2019-02-04 NOTE — Assessment & Plan Note (Signed)
Tenderness in right temple - mild - present for weeks No obvious vessel tenderness Will check esr

## 2019-02-04 NOTE — Assessment & Plan Note (Addendum)
BP elevated x 2 Start taking spironolactone 12. 5 mg daily She will monitor BP daily at home - discussed goal Continue low sodium diet encouraged regular exercise, weight loss cmp

## 2019-02-04 NOTE — Assessment & Plan Note (Addendum)
Not using cpap - tried a couple of different masks and they are not comfortable Has not seen Dr. Halford Chessman in a while and I did recommend scheduling appointment with him to discuss other options She has noticed chronic fatigue and brain fog since COVID, but discussed long-term consequences of untreated sleep apnea, which could also be contributing

## 2019-02-04 NOTE — Assessment & Plan Note (Signed)
Takes spironolactone prn, but will start daily for elevated BP

## 2019-02-04 NOTE — Assessment & Plan Note (Signed)
GERD not ideally controlled Has nausea in morning and some GERD Working on diet Can increase medication

## 2019-02-05 ENCOUNTER — Encounter: Payer: Self-pay | Admitting: Internal Medicine

## 2019-02-12 ENCOUNTER — Encounter: Payer: Self-pay | Admitting: Internal Medicine

## 2019-05-14 ENCOUNTER — Telehealth: Payer: 59 | Admitting: Family

## 2019-05-14 DIAGNOSIS — J069 Acute upper respiratory infection, unspecified: Secondary | ICD-10-CM

## 2019-05-14 MED ORDER — FLUTICASONE PROPIONATE 50 MCG/ACT NA SUSP: 2.0000 | Freq: Every day | NASAL | 6 refills | Status: DC

## 2019-05-14 NOTE — Progress Notes (Signed)
We are sorry you are not feeling well.  Here is how we plan to help!  Based on what you have shared with me, it looks like you may have a viral upper respiratory infection.  Upper respiratory infections are caused by a large number of viruses; however, rhinovirus is the most common cause.   Symptoms vary from person to person, with common symptoms including sore throat, cough, fatigue or lack of energy and feeling of general discomfort.  A low-grade fever of up to 100.4 may present, but is often uncommon.  Symptoms vary however, and are closely related to a person's age or underlying illnesses.  The most common symptoms associated with an upper respiratory infection are nasal discharge or congestion, cough, sneezing, headache and pressure in the ears and face.  These symptoms usually persist for about 3 to 10 days, but can last up to 2 weeks.  It is important to know that upper respiratory infections do not cause serious illness or complications in most cases.    Upper respiratory infections can be transmitted from person to person, with the most common method of transmission being a person's hands.  The virus is able to live on the skin and can infect other persons for up to 2 hours after direct contact.  Also, these can be transmitted when someone coughs or sneezes; thus, it is important to cover the mouth to reduce this risk.  To keep the spread of the illness at [place], good hand hygiene is very important.  This is an infection that is most likely caused by a virus. There are no specific treatments other than to help you with the symptoms until the infection runs its course.  We are sorry you are not feeling well.  Here is how we plan to help!   For nasal congestion, you may use an oral decongestants such as Mucinex D or if you have glaucoma or high blood pressure use plain Mucinex.  Saline nasal spray or nasal drops can help and can safely be used as often as needed for congestion.  For your congestion,  I have prescribed Fluticasone nasal spray one spray in each nostril twice a day.  You also may need to be COVID tested. You can go to one of the testing sites listed below, while they are opened (see hours). You do not need an order and will stay in your car during the test. You do need to self isolate until your results return and if positive 14 days from when your symptoms started and until you are 3 days fever free.   Testing Locations (Monday - Friday, 10 a.m. - 3 p.m.) . [place]: [facility] at [facility], [address] [place], [place]: [place], [place], [place], [place] (entrance [address])  . [facility]: (Closed each Monday): Testing site relocated to the short stay covered drive at [facility]. (Use the [employer] entrance to [facility] next to [facility].)   If you do not have a history of heart disease, hypertension, diabetes or thyroid disease, prostate/bladder issues or glaucoma, you may also use Sudafed to treat nasal congestion.  It is highly recommended that you consult with a pharmacist or your primary care physician to ensure this medication is safe for you to take.     If you have a cough, you may use cough suppressants such as Delsym and Robitussin.  If you have  glaucoma or high blood pressure, you can also use Coricidin HBP.     If you have a sore or scratchy throat, use a saltwater gargle-  to  teaspoon of salt dissolved in a 4-ounce to 8-ounce glass of warm water.  Gargle the solution for approximately 15-30 seconds and then spit.  It is important not to swallow the solution.  You can also use throat lozenges/cough drops and Chloraseptic spray to help with throat pain or discomfort.  Warm or cold liquids can also be helpful in relieving throat pain.  For headache, pain or general discomfort, you can use Ibuprofen or Tylenol as directed.   Some authorities  believe that zinc sprays or the use of Echinacea may shorten the course of your symptoms.   HOME CARE . Only take medications as instructed by your medical team. . Be sure to drink plenty of fluids. Water is fine as well as fruit juices, sodas and electrolyte beverages. You may want to stay away from caffeine or alcohol. If you are nauseated, try taking small sips of liquids. How do you know if you are getting enough fluid? Your urine should be a pale yellow or almost colorless. . Get rest. . Taking a steamy shower or using a humidifier may help nasal congestion and ease sore throat pain. You can place a towel over your head and breathe in the steam from hot water coming from a faucet. . Using a saline nasal spray works much the same way. . Cough drops, hard candies and sore throat lozenges may ease your cough. . Avoid close contacts especially the very young and the elderly . Cover your mouth if you cough or sneeze . Always remember to wash your hands.   GET HELP RIGHT AWAY IF: . You develop worsening fever. . If your symptoms do not improve within 10 days . You develop yellow or green discharge from your nose over 3 days. . You have coughing fits . You develop a severe head ache or visual changes. . You develop shortness of breath, difficulty breathing or start having chest pain . Your symptoms persist after you have completed your treatment plan  MAKE SURE YOU   Understand these instructions.  Will watch your condition.  Will get help right away if you are not doing well or get worse.  Your e-visit answers were reviewed by a board certified advanced clinical practitioner to complete your personal care plan. Depending upon the condition, your plan could have included both over the counter or prescription medications. Please review your pharmacy choice. If there is a problem, you may call our nursing hot line at and have the prescription routed to another pharmacy. Your safety is  important to [place]. If you have drug allergies check your prescription carefully.   You can use MyChart to ask questions about today's visit, request a non-urgent call back, or ask for a work or school excuse for 24 hours related to this e-Visit. If it has been greater than 24 hours you will need to follow up with your provider, or enter a new e-Visit to address those concerns. You will get an e-mail in the next two days asking about your experience.  I hope that your e-visit has been valuable and will speed your recovery. Thank you for using e-visits.    Approximately 5 minutes was spent documenting and reviewing patient's chart.

## 2019-07-02 DIAGNOSIS — G43109 Migraine with aura, not intractable, without status migrainosus: Secondary | ICD-10-CM

## 2019-07-02 DIAGNOSIS — L308 Other specified dermatitis: Secondary | ICD-10-CM | POA: Insufficient documentation

## 2019-07-02 HISTORY — DX: Other specified dermatitis: L30.8

## 2019-07-02 HISTORY — DX: Migraine with aura, not intractable, without status migrainosus: G43.109

## 2020-09-01 ENCOUNTER — Other Ambulatory Visit: Payer: Self-pay
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
## 2020-09-02 ENCOUNTER — Ambulatory Visit (INDEPENDENT_AMBULATORY_CARE_PROVIDER_SITE_OTHER): Payer: 59 | Admitting: Internal Medicine

## 2020-09-02 ENCOUNTER — Encounter (HOSPITAL_COMMUNITY): Payer: Self-pay

## 2020-09-02 ENCOUNTER — Encounter: Payer: Self-pay | Admitting: Internal Medicine

## 2020-09-02 ENCOUNTER — Other Ambulatory Visit (INDEPENDENT_AMBULATORY_CARE_PROVIDER_SITE_OTHER): Payer: 59

## 2020-09-02 VITALS — BP 132/74 | HR 66 | Temp 98.3°F | Ht 64.0 in | Wt 176.0 lb

## 2020-09-02 DIAGNOSIS — R1031 Right lower quadrant pain: Secondary | ICD-10-CM

## 2020-09-02 DIAGNOSIS — R002 Palpitations: Secondary | ICD-10-CM

## 2020-09-02 DIAGNOSIS — R0989 Other specified symptoms and signs involving the circulatory and respiratory systems: Secondary | ICD-10-CM

## 2020-09-02 DIAGNOSIS — R946 Abnormal results of thyroid function studies: Secondary | ICD-10-CM

## 2020-09-02 DIAGNOSIS — E782 Mixed hyperlipidemia: Secondary | ICD-10-CM

## 2020-09-02 DIAGNOSIS — I10 Essential (primary) hypertension: Secondary | ICD-10-CM

## 2020-09-02 DIAGNOSIS — R1011 Right upper quadrant pain: Secondary | ICD-10-CM | POA: Insufficient documentation

## 2020-09-02 HISTORY — DX: Right lower quadrant pain: R10.31

## 2020-09-02 HISTORY — DX: Right upper quadrant pain: R10.11

## 2020-09-02 HISTORY — DX: Other specified symptoms and signs involving the circulatory and respiratory systems: R09.89

## 2020-09-02 HISTORY — DX: Palpitations: R00.2

## 2020-09-02 LAB — URINALYSIS, ROUTINE W REFLEX MICROSCOPIC
Bilirubin Urine: NEGATIVE
Hgb urine dipstick: NEGATIVE
Ketones, ur: NEGATIVE
Leukocytes,Ua: NEGATIVE
Nitrite: NEGATIVE
RBC / HPF: NONE SEEN (ref 0–?)
Specific Gravity, Urine: 1.015 (ref 1.000–1.030)
Total Protein, Urine: NEGATIVE
Urine Glucose: NEGATIVE
Urobilinogen, UA: 0.2 (ref 0.0–1.0)
pH: 6 (ref 5.0–8.0)

## 2020-09-02 LAB — COMPREHENSIVE METABOLIC PANEL
ALT: 19 U/L (ref 0–35)
AST: 18 U/L (ref 0–37)
Albumin: 4.4 g/dL (ref 3.5–5.2)
Alkaline Phosphatase: 76 U/L (ref 39–117)
BUN: 15 mg/dL (ref 6–23)
CO2: 30 mEq/L (ref 19–32)
Calcium: 9.7 mg/dL (ref 8.4–10.5)
Chloride: 104 mEq/L (ref 96–112)
Creatinine, Ser: 0.8 mg/dL (ref 0.40–1.20)
GFR: 79.77 mL/min (ref >60.00)
Glucose, Bld: 94 mg/dL (ref 70–99)
Potassium: 4 mEq/L (ref 3.5–5.1)
Sodium: 142 mEq/L (ref 135–145)
Total Bilirubin: 0.8 mg/dL (ref 0.2–1.2)
Total Protein: 7 g/dL (ref 6.0–8.3)

## 2020-09-02 LAB — CBC WITH DIFFERENTIAL/PLATELET
Basophils Absolute: 0.1 10*3/uL (ref 0.0–0.1)
Basophils Relative: 2 % (ref 0.0–3.0)
Eosinophils Absolute: 0.1 10*3/uL (ref 0.0–0.7)
Eosinophils Relative: 2.3 % (ref 0.0–5.0)
HCT: 40.7 % (ref 36.0–46.0)
Hemoglobin: 13.6 g/dL (ref 12.0–15.0)
Lymphocytes Relative: 40.7 % (ref 12.0–46.0)
Lymphs Abs: 2.4 10*3/uL (ref 0.7–4.0)
MCHC: 33.4 g/dL (ref 30.0–36.0)
MCV: 89.9 fl (ref 78.0–100.0)
Monocytes Absolute: 0.5 10*3/uL (ref 0.1–1.0)
Monocytes Relative: 8.2 % (ref 3.0–12.0)
Neutro Abs: 2.7 10*3/uL (ref 1.4–7.7)
Neutrophils Relative %: 46.8 % (ref 43.0–77.0)
Platelets: 240 10*3/uL (ref 150.0–400.0)
RBC: 4.53 Mil/uL (ref 3.87–5.11)
RDW: 13.9 % (ref 11.5–15.5)
WBC: 5.8 10*3/uL (ref 4.0–10.5)

## 2020-09-02 LAB — LIPID PANEL
Cholesterol: 226 mg/dL — ABNORMAL HIGH (ref 0–200)
HDL: 69.3 mg/dL (ref >39.00)
LDL Cholesterol: 140 mg/dL — ABNORMAL HIGH (ref 0–99)
NonHDL: 157.1
Total CHOL/HDL Ratio: 3
Triglycerides: 85 mg/dL (ref 0.0–149.0)
VLDL: 17 mg/dL (ref 0.0–40.0)

## 2020-09-02 LAB — TSH: TSH: 0.62 u[IU]/mL (ref 0.35–4.50)

## 2020-09-02 NOTE — Assessment & Plan Note (Signed)
Chronic Check lipid panel, CMP Discussed that as she gets older her risk increases for heart attack and strokes and she may need to start to consider a statin Stressed regular exercise, healthy diet and keeping her weight down

## 2020-09-02 NOTE — Assessment & Plan Note (Signed)
Chronic Blood pressure well controlled Continue spironolactone 12.5 mg daily CMP

## 2020-09-02 NOTE — Patient Instructions (Addendum)
An EKG was done today.   Blood work was ordered.     Medications changes include :   none    A referral was ordered for Cardiology       Someone from their office will call you to schedule an appointment.    A Ct scan was ordered for your abdomen.  An Echocardiogram was ordered (ultrasound of your heart).  An ultrasound of your carotid arteries was ordered.

## 2020-09-02 NOTE — Addendum Note (Signed)
Addended by: Marcina Millard on: 09/02/2020 04:13 PM   Modules accepted: Orders

## 2020-09-02 NOTE — Assessment & Plan Note (Signed)
New problem She states palpitations for approximately 1 month-occurring every day and can be continuous for days Associated with lightheadedness, shortness of breath and once she had some tightness in her chest She states she had palpitations years ago and she would cough and it would go away, but this is not working now No obvious cause EKG today normal sinus rhythm at 60 bpm, normal EKG.  Compared to last EKG from 2016 no longer bradycardic and T waves are no longer inverted-no major changes Check CBC, CMP, TSH Will refer to cardiology for possible Holter and further evaluation Echocardiogram ordered

## 2020-09-02 NOTE — Addendum Note (Signed)
Addended by: Marcina Millard on: 09/02/2020 04:14 PM   Modules accepted: Orders

## 2020-09-02 NOTE — Assessment & Plan Note (Signed)
Acute Intermittent right lower quadrant pain without obvious associations or cause She does have some tenderness on exam She has had a hysterectomy, still had her appendix No obvious association with urinary symptoms or bowel symptoms Given the tenderness on exam will check CT of the abdomen and pelvis CBC, CMP, urinalysis

## 2020-09-02 NOTE — Assessment & Plan Note (Signed)
New problem States intermittent right upper quadrant pain Sounds more muscular in nature-slight tenderness on exam CBC, CMP, UA We will be getting CT scan to evaluate right lower quadrant which will also help evaluate the right upper quadrant

## 2020-09-02 NOTE — Assessment & Plan Note (Signed)
New Right carotid bruit heard Carotid ultrasound ordered

## 2020-09-07 NOTE — Addendum Note (Signed)
Addended by: Marcina Millard on: 09/07/2020 10:50 AM   Modules accepted: Orders

## 2020-09-10 ENCOUNTER — Ambulatory Visit (INDEPENDENT_AMBULATORY_CARE_PROVIDER_SITE_OTHER)
Admission: RE | Admit: 2020-09-10 | Discharge: 2020-09-10 | Disposition: A | Discharge status: Home / Self Care | Payer: 59 | Source: Ambulatory Visit | Attending: Internal Medicine | Admitting: Internal Medicine

## 2020-09-10 ENCOUNTER — Other Ambulatory Visit: Payer: Self-pay

## 2020-09-10 DIAGNOSIS — R1031 Right lower quadrant pain: Secondary | ICD-10-CM | POA: Diagnosis not present

## 2020-09-10 DIAGNOSIS — R1011 Right upper quadrant pain: Secondary | ICD-10-CM | POA: Diagnosis not present

## 2020-09-10 MED ORDER — IOHEXOL 300 MG/ML  SOLN
100.0000 mL | Freq: Once | INTRAMUSCULAR | Status: AC | PRN
  Administered 2020-09-10: 100 mL via INTRAVENOUS

## 2020-09-11 ENCOUNTER — Encounter: Payer: Self-pay | Admitting: Internal Medicine

## 2020-09-11 DIAGNOSIS — I700 Atherosclerosis of aorta: Secondary | ICD-10-CM

## 2020-09-11 HISTORY — DX: Atherosclerosis of aorta: I70.0

## 2020-09-16 ENCOUNTER — Ambulatory Visit (HOSPITAL_COMMUNITY)
Admission: RE | Admit: 2020-09-16 | Discharge: 2020-09-16 | Disposition: A | Discharge status: Home / Self Care | Payer: 59 | Source: Ambulatory Visit | Attending: Internal Medicine | Admitting: Internal Medicine

## 2020-09-16 ENCOUNTER — Other Ambulatory Visit: Payer: Self-pay

## 2020-09-16 DIAGNOSIS — R0989 Other specified symptoms and signs involving the circulatory and respiratory systems: Secondary | ICD-10-CM | POA: Diagnosis present

## 2020-10-19 ENCOUNTER — Other Ambulatory Visit: Payer: Self-pay

## 2020-10-19 ENCOUNTER — Ambulatory Visit (HOSPITAL_COMMUNITY): Payer: 59 | Attending: Cardiology

## 2020-10-19 DIAGNOSIS — R002 Palpitations: Secondary | ICD-10-CM | POA: Diagnosis present

## 2020-10-19 LAB — ECHOCARDIOGRAM COMPLETE
Area-P 1/2: 4.68 cm2
S' Lateral: 3 cm

## 2020-10-19 MED ORDER — PERFLUTREN LIPID MICROSPHERE
1.0000 mL | INTRAVENOUS | Status: AC | PRN
  Administered 2020-10-19: 1 mL via INTRAVENOUS

## 2020-10-25 ENCOUNTER — Telehealth: Payer: 59 | Admitting: Emergency Medicine

## 2020-10-25 DIAGNOSIS — J019 Acute sinusitis, unspecified: Secondary | ICD-10-CM

## 2020-10-25 MED ORDER — DOXYCYCLINE HYCLATE 100 MG PO TABS: 100.0000 mg | ORAL_TABLET | Freq: Two times a day (BID) | ORAL | 0 refills | Status: AC

## 2020-10-25 NOTE — Progress Notes (Signed)

## 2020-11-01 ENCOUNTER — Ambulatory Visit: Payer: 59 | Admitting: Interventional Cardiology

## 2020-11-22 ENCOUNTER — Other Ambulatory Visit: Payer: Self-pay

## 2020-11-22 DIAGNOSIS — N952 Postmenopausal atrophic vaginitis: Secondary | ICD-10-CM

## 2020-11-22 DIAGNOSIS — E042 Nontoxic multinodular goiter: Secondary | ICD-10-CM | POA: Insufficient documentation

## 2020-11-22 DIAGNOSIS — Z8669 Personal history of other diseases of the nervous system and sense organs: Secondary | ICD-10-CM

## 2020-11-22 HISTORY — DX: Personal history of other diseases of the nervous system and sense organs: Z86.69

## 2020-11-22 HISTORY — DX: Postmenopausal atrophic vaginitis: N95.2

## 2020-11-25 ENCOUNTER — Encounter: Payer: Self-pay | Admitting: Cardiology

## 2020-11-25 ENCOUNTER — Other Ambulatory Visit: Payer: Self-pay

## 2020-11-25 ENCOUNTER — Ambulatory Visit (INDEPENDENT_AMBULATORY_CARE_PROVIDER_SITE_OTHER): Payer: 59 | Admitting: Cardiology

## 2020-11-25 VITALS — BP 126/88 | HR 70 | Ht 64.6 in | Wt 176.6 lb

## 2020-11-25 DIAGNOSIS — E782 Mixed hyperlipidemia: Secondary | ICD-10-CM

## 2020-11-25 DIAGNOSIS — I700 Atherosclerosis of aorta: Secondary | ICD-10-CM | POA: Diagnosis not present

## 2020-11-25 DIAGNOSIS — I7090 Unspecified atherosclerosis: Secondary | ICD-10-CM | POA: Insufficient documentation

## 2020-11-25 DIAGNOSIS — I10 Essential (primary) hypertension: Secondary | ICD-10-CM | POA: Diagnosis not present

## 2020-11-25 DIAGNOSIS — R002 Palpitations: Secondary | ICD-10-CM | POA: Diagnosis not present

## 2020-11-25 HISTORY — DX: Unspecified atherosclerosis: I70.90

## 2020-11-25 LAB — LIPID PANEL
Chol/HDL Ratio: 3.3 ratio (ref 0.0–4.4)
Cholesterol, Total: 249 mg/dL — ABNORMAL HIGH (ref 100–199)
HDL: 76 mg/dL (ref >39)
LDL Chol Calc (NIH): 157 mg/dL — ABNORMAL HIGH (ref 0–99)
Triglycerides: 91 mg/dL (ref 0–149)
VLDL Cholesterol Cal: 16 mg/dL (ref 5–40)

## 2020-11-25 LAB — BASIC METABOLIC PANEL
BUN/Creatinine Ratio: 20 (ref 12–28)
BUN: 16 mg/dL (ref 8–27)
CO2: 25 mmol/L (ref 20–29)
Calcium: 9.9 mg/dL (ref 8.7–10.3)
Chloride: 103 mmol/L (ref 96–106)
Creatinine, Ser: 0.81 mg/dL (ref 0.57–1.00)
Glucose: 93 mg/dL (ref 65–99)
Potassium: 4.6 mmol/L (ref 3.5–5.2)
Sodium: 142 mmol/L (ref 134–144)
eGFR: 83 mL/min/{1.73_m2} (ref >59)

## 2020-11-25 LAB — HEPATIC FUNCTION PANEL
ALT: 26 IU/L (ref 0–32)
AST: 20 IU/L (ref 0–40)
Albumin: 4.5 g/dL (ref 3.8–4.8)
Alkaline Phosphatase: 96 IU/L (ref 44–121)
Bilirubin Total: 0.8 mg/dL (ref 0.0–1.2)
Bilirubin, Direct: 0.17 mg/dL (ref 0.00–0.40)
Total Protein: 6.8 g/dL (ref 6.0–8.5)
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
## 2020-11-25 NOTE — Patient Instructions (Signed)
Medication Instructions:  No medication changes. *If you need a refill on your cardiac medications before your next appointment, please call your pharmacy*   Lab Work: Your physician recommends that you have labs done in the office today. Your test included  basic metabolic panel, liver function and lipids.  If you have labs (blood work) drawn today and your tests are completely normal, you will receive your results only by: [place] (if you have MyChart) OR A paper copy in the mail If you have any lab test that is abnormal or we need to change your treatment, we will call you to review the results.   Testing/Procedures: None ordered   Follow-Up: At [facility], you and your health needs are our priority.  As part of our continuing mission to provide you with exceptional heart care, we have created designated Provider Care Teams.  These Care Teams include your primary Cardiologist (physician) and Advanced Practice Providers (APPs -  Physician Assistants and Nurse Practitioners) who all work together to provide you with the care you need, when you need it.  We recommend signing up for the patient portal called "MyChart".  Sign up information is provided on this After Visit Summary.  MyChart is used to connect with patients for Virtual Visits (Telemedicine).  Patients are able to view lab/test results, encounter notes, upcoming appointments, etc.  Non-urgent messages can be sent to your provider as well.   To learn more about what you can do with MyChart, go to [email or web address].    Your next appointment:   3 month(s)  The format for your next appointment:   In Person  Provider:   Jyl Heinz, MD   Other Instructions NA

## 2020-11-26 MED ORDER — ROSUVASTATIN CALCIUM 10 MG PO TABS: 10.0000 mg | ORAL_TABLET | Freq: Every day | ORAL | 3 refills | Status: DC

## 2020-11-26 NOTE — Addendum Note (Signed)
Addended by: Truddie Hidden on: 11/26/2020 09:33 AM   Modules accepted: Orders

## 2020-12-29 ENCOUNTER — Telehealth: Payer: 59 | Admitting: Family Medicine

## 2020-12-29 DIAGNOSIS — U071 COVID-19: Secondary | ICD-10-CM

## 2020-12-29 NOTE — Progress Notes (Signed)
Canceled for VV

## 2020-12-30 ENCOUNTER — Telehealth: Payer: 59 | Admitting: Physician Assistant

## 2020-12-30 DIAGNOSIS — U071 COVID-19: Secondary | ICD-10-CM

## 2020-12-30 MED ORDER — BENZONATATE 100 MG PO CAPS: 100.0000 mg | ORAL_CAPSULE | Freq: Three times a day (TID) | ORAL | 0 refills | Status: DC | PRN

## 2020-12-30 MED ORDER — MOLNUPIRAVIR EUA 200MG CAPSULE: 4.0000 | ORAL_CAPSULE | Freq: Two times a day (BID) | ORAL | 0 refills | Status: AC

## 2020-12-30 NOTE — Patient Instructions (Signed)
Lucina Mellow, thank you for joining Leeanne Rio, PA-C for today's virtual visit.  While this provider is not your primary care provider (PCP), if your PCP is located in our provider database this encounter information will be shared with them immediately following your visit.  Consent: (Patient) DESAREE DOWNEN provided verbal consent for this virtual visit at the beginning of the encounter.  Current Medications:  Current Outpatient Medications:    benzonatate (TESSALON) 100 MG capsule, Take 1 capsule (100 mg total) by mouth 3 (three) times daily as needed for cough., Disp: 30 capsule, Rfl: 0   molnupiravir EUA 200 mg CAPS, Take 4 capsules (800 mg total) by mouth 2 (two) times daily for 5 days., Disp: 40 capsule, Rfl: 0   cholecalciferol (VITAMIN D3) 25 MCG (1000 UNIT) tablet, Take 1,000 Units by mouth daily., Disp: , Rfl:    Lansoprazole (PREVACID PO), Take 1 tablet by mouth daily., Disp: , Rfl:    rosuvastatin (CRESTOR) 10 MG tablet, Take 1 tablet (10 mg total) by mouth daily., Disp: 90 tablet, Rfl: 3   spironolactone (ALDACTONE) 25 MG tablet, Take 12.5 mg by mouth as needed (swelling legs and ankles)., Disp: , Rfl:    Medications ordered in this encounter:  Meds ordered this encounter  Medications   molnupiravir EUA 200 mg CAPS    Sig: Take 4 capsules (800 mg total) by mouth 2 (two) times daily for 5 days.    Dispense:  40 capsule    Refill:  0    Order Specific Question:   Supervising Provider    Answer:   MILLER, BRIAN [[id number]]   benzonatate (TESSALON) 100 MG capsule    Sig: Take 1 capsule (100 mg total) by mouth 3 (three) times daily as needed for cough.    Dispense:  30 capsule    Refill:  0    Order Specific Question:   Supervising Provider    Answer:   Sabra Heck, [place]     *If you need refills on other medications prior to your next appointment, please contact your pharmacy*  Follow-Up: Call back or seek an in-person evaluation if the symptoms worsen or if the  condition fails to improve as anticipated.  Other Instructions Please keep well-hydrated and get plenty of rest. Start a saline nasal rinse to flush out your nasal passages. You can use plain Mucinex to help thin congestion. If you have a humidifier, running in the bedroom at night. I want you to start OTC vitamin D3 1000 units daily, vitamin C 1000 mg daily, and a zinc supplement. Please take prescribed medications as directed.  You have been enrolled in a MyChart symptom monitoring program. Please answer these questions daily so we can keep track of how you are doing.  You were to quarantine for 5 days from onset of your symptoms.  After day 5, if you have had no fever and you are feeling better, you can end quarantine but need to mask for an additional 5 days. After day 5 if you have a fever or are having significant symptoms, please quarantine for full 10 days.  If you note any worsening of symptoms, any significant shortness of breath or any chest pain, please seek ER evaluation ASAP.  Please do not delay care!    If you have been instructed to have an in-person evaluation today at a local Urgent Care facility, please use the link below. It will take you to a list of all of our available  [facility], including address, phone number and hours of operation. Please do not delay care.  [facility]  If you or a family member do not have a primary care provider, use the link below to schedule a visit and establish care. When you choose a  primary care physician or advanced practice provider, you gain a long-term partner in health. Find a Primary Care Provider  Learn more about 's in-office and virtual care options: [facility]

## 2020-12-30 NOTE — Progress Notes (Signed)
Virtual Visit Consent   Penny Valdez, you are scheduled for a virtual visit with a [facility] provider today.     Just as with appointments in the office, your consent must be obtained to participate.  Your consent will be active for this visit and any virtual visit you may have with one of our providers in the next 365 days.     If you have a MyChart account, a copy of this consent can be sent to you electronically.  All virtual visits are billed to your insurance company just like a traditional visit in the office.    As this is a virtual visit, video technology does not allow for your provider to perform a traditional examination.  This may limit your provider's ability to fully assess your condition.  If your provider identifies any concerns that need to be evaluated in person or the need to arrange testing (such as labs, EKG, etc.), we will make arrangements to do so.     Although advances in technology are sophisticated, we cannot ensure that it will always work on either your end or our end.  If the connection with a video visit is poor, the visit may have to be switched to a telephone visit.  With either a video or telephone visit, we are not always able to ensure that we have a secure connection.     I need to obtain your verbal consent now.   Are you willing to proceed with your visit today?    Penny Valdez has provided verbal consent on 12/30/2020 for a virtual visit (video or telephone).   Leeanne Rio, [place]   Date: 12/30/2020 8:54 AM   Virtual Visit via Video Note   I, Leeanne Rio, connected with  Penny Valdez  ([id number], [birth year]) on 12/30/20 at  8:45 AM EDT by a video-enabled telemedicine application and verified that I am speaking with the correct person using two identifiers.  Location: Patient: Virtual Visit Location Patient: Home Provider: Virtual Visit Location Provider: Home Office   I discussed the limitations of evaluation and management by  telemedicine and the availability of in person appointments. The patient expressed understanding and agreed to proceed.    History of Present Illness: Penny Valdez is a 60 y.o. who identifies as a female who was assigned female at birth, and is being seen today for COVID-19. Abrupt onset of symptoms yesterday at lunch time with rhinorrhea, fever, chills, body aches, sinus congestion and pressure. Denies chest congestion, chest pain or SOB. Denies recent travel or sick contact. Had + home test yesterday evening. Has not taken anything for symptoms so far.   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   Migraine with aura and without status migrainosus, not intractable 07/02/2019   Forehead pain 02/04/2019   Hypertension 02/04/2019   Fatigue 02/04/2019   Nonintractable headache 11/19/2018   Eye redness 11/19/2018   Conjunctivitis of both eyes 10/22/2018   Infection due to 2019-nCoV 10/04/2018   Nausea and vomiting 10/04/2018   Edema 05/24/2018   Leg cramps 05/24/2018   Actinic keratoses 08/03/2017  Arthritis of knee 04/30/2017   Ganglion of flexor tendon sheath of right middle finger 04/30/2017   History of vitamin D deficiency 04/30/2017   Gastroesophageal reflux disease without esophagitis 04/30/2017   Bilateral leg edema 02/26/2016   Gilbert's syndrome 02/25/2016   Cephalalgia 02/25/2016   Thyroid nodule, hot 01/11/2015   Vitamin D deficiency 08/06/2014   Tubular adenoma of colon 08/01/2013   Osteopenia 07/02/2012   Obstructive sleep apnea 07/14/2010   Diverticulosis of colon  11/04/2008   Uterine cancer ([place]) 10/16/2008   ESOPHAGEAL STRICTURE 08/27/2008   Endometrial carcinoma ([place]) 06/12/2008   Depression 06/13/2007   Hyperlipidemia 01/29/2007   GERD (gastroesophageal reflux disease) 01/29/2007    Allergies:  Allergies  Allergen Reactions   Nizatidine     REACTION: swelling of hands & feet with Axid   Omeprazole Anaphylaxis   Atorvastatin Other (See Comments)    Joint pain   Penicillins     As child; does not remember   Prednisone     Facial swelling from dosepack from [facility] 2013   Prochlorperazine Edisylate Other (See Comments)    Looses muscle control   Tobramycin     Eye irritation   Esomeprazole Magnesium     Mental status changes   Latex Rash   Medications:  Current Outpatient Medications:    benzonatate (TESSALON) 100 MG capsule, Take 1 capsule (100 mg total) by mouth 3 (three) times daily as needed for cough., Disp: 30 capsule, Rfl: 0   molnupiravir EUA 200 mg CAPS, Take 4 capsules (800 mg total) by mouth 2 (two) times daily for 5 days., Disp: 40 capsule, Rfl: 0   cholecalciferol (VITAMIN D3) 25 MCG (1000 UNIT) tablet, Take 1,000 Units by mouth daily., Disp: , Rfl:    Lansoprazole (PREVACID PO), Take 1 tablet by mouth daily., Disp: , Rfl:    rosuvastatin (CRESTOR) 10 MG tablet, Take 1 tablet (10 mg total) by mouth daily., Disp: 90 tablet, Rfl: 3   spironolactone (ALDACTONE) 25 MG tablet, Take 12.5 mg by mouth as needed (swelling legs and ankles)., Disp: , Rfl:   Observations/Objective: Patient is well-developed, well-nourished in no acute distress.  Resting comfortably at home.  Head is normocephalic, atraumatic.  No labored breathing. Speech is clear and coherent with logical content.  Patient is alert and oriented at baseline.   Assessment and Plan: 1. COVID-19 - molnupiravir EUA 200 mg CAPS; Take 4 capsules (800 mg total) by mouth 2 (two) times daily for 5 days.  Dispense: 40 capsule; Refill: 0 - benzonatate (TESSALON) 100  MG capsule; Take 1 capsule (100 mg total) by mouth 3 (three) times daily as needed for cough.  Dispense: 30 capsule; Refill: 0 Patient with multiple risk factors for complicated course of illness. Discussed risks/benefits of antiviral medications including most common potential ADRs. Patient voiced understanding and would like to proceed with antiviral medication. They are candidate for Molnupiravir. Rx sent to pharmacy. Supportive measures, OTC medications and vitamin regimen reviewed. Rx Tessalon for cough. Patient has been enrolled in a MyChart COVID symptom monitoring program. Samule Dry reviewed in detail. Strict ER precautions discussed with patient.   Follow Up Instructions: I discussed the assessment and treatment plan with the patient. The patient was provided an opportunity to ask questions and all were answered. The patient agreed with the plan and demonstrated an understanding of the instructions.  A copy of instructions were sent to the patient via MyChart.  The patient was advised to call back or seek an  in-person evaluation if the symptoms worsen or if the condition fails to improve as anticipated.  Time:  I spent 15 minutes with the patient via telehealth technology discussing the above problems/concerns.    Leeanne Rio, PA-C

## 2021-02-11 ENCOUNTER — Other Ambulatory Visit: Payer: Self-pay

## 2021-02-17 ENCOUNTER — Ambulatory Visit: Payer: 59 | Admitting: Cardiology

## 2021-04-07 ENCOUNTER — Ambulatory Visit: Payer: 59 | Admitting: Cardiology

## 2021-06-15 ENCOUNTER — Telehealth: Payer: 59 | Admitting: Physician Assistant

## 2021-06-15 DIAGNOSIS — B9689 Other specified bacterial agents as the cause of diseases classified elsewhere: Secondary | ICD-10-CM | POA: Diagnosis not present

## 2021-06-15 DIAGNOSIS — J019 Acute sinusitis, unspecified: Secondary | ICD-10-CM

## 2021-06-15 MED ORDER — DOXYCYCLINE HYCLATE 100 MG PO TABS: 100.0000 mg | ORAL_TABLET | Freq: Two times a day (BID) | ORAL | 0 refills | Status: DC

## 2021-06-15 NOTE — Progress Notes (Signed)
I have spent 5 minutes in review of e-visit questionnaire, review and updating patient chart, medical decision making and response to patient.   Penny Whack Cody Pansy Ostrovsky, PA-C    

## 2021-06-15 NOTE — Progress Notes (Signed)

## 2021-06-28 DIAGNOSIS — Z01419 Encounter for gynecological examination (general) (routine) without abnormal findings: Secondary | ICD-10-CM | POA: Diagnosis not present

## 2021-06-28 DIAGNOSIS — Z6829 Body mass index (BMI) 29.0-29.9, adult: Secondary | ICD-10-CM | POA: Diagnosis not present

## 2021-06-28 DIAGNOSIS — Z1231 Encounter for screening mammogram for malignant neoplasm of breast: Secondary | ICD-10-CM | POA: Diagnosis not present

## 2021-06-28 LAB — HM MAMMOGRAPHY

## 2021-06-30 ENCOUNTER — Ambulatory Visit (INDEPENDENT_AMBULATORY_CARE_PROVIDER_SITE_OTHER): Payer: BC Managed Care – PPO | Admitting: Cardiology

## 2021-06-30 ENCOUNTER — Encounter: Payer: Self-pay | Admitting: Cardiology

## 2021-06-30 ENCOUNTER — Other Ambulatory Visit: Payer: Self-pay

## 2021-06-30 VITALS — BP 130/84 | HR 60 | Ht 64.6 in | Wt 179.6 lb

## 2021-06-30 DIAGNOSIS — I7090 Unspecified atherosclerosis: Secondary | ICD-10-CM | POA: Diagnosis not present

## 2021-06-30 DIAGNOSIS — R002 Palpitations: Secondary | ICD-10-CM

## 2021-06-30 DIAGNOSIS — I10 Essential (primary) hypertension: Secondary | ICD-10-CM

## 2021-06-30 DIAGNOSIS — E782 Mixed hyperlipidemia: Secondary | ICD-10-CM

## 2021-06-30 DIAGNOSIS — E559 Vitamin D deficiency, unspecified: Secondary | ICD-10-CM

## 2021-06-30 DIAGNOSIS — I700 Atherosclerosis of aorta: Secondary | ICD-10-CM

## 2021-06-30 MED ORDER — SPIRONOLACTONE 25 MG PO TABS: 12.5000 mg | ORAL_TABLET | ORAL | 3 refills | Status: DC | PRN
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
## 2021-06-30 NOTE — Patient Instructions (Signed)
Medication Instructions:  Your physician recommends that you continue on your current medications as directed. Please refer to the Current Medication list given to you today.  *If you need a refill on your cardiac medications before your next appointment, please call your pharmacy*   Lab Work: Your physician recommends that you return for lab work in: 1 week You need to have labs done when you are fasting.  You can come Monday through Friday 8:30 am to 12:00 pm and 1:15 to 4:30. You do not need to make an appointment as the order has already been placed. The labs you are going to have done are BMET, CBC, TSH, vitamin D, LFT and Lipids.  If you have labs (blood work) drawn today and your tests are completely normal, you will receive your results only by: [place] (if you have MyChart) OR A paper copy in the mail If you have any lab test that is abnormal or we need to change your treatment, we will call you to review the results.   Testing/Procedures: None ordered   Follow-Up: At [facility], you and your health needs are our priority.  As part of our continuing mission to provide you with exceptional heart care, we have created designated Provider Care Teams.  These Care Teams include your primary Cardiologist (physician) and Advanced Practice Providers (APPs -  Physician Assistants and Nurse Practitioners) who all work together to provide you with the care you need, when you need it.  We recommend signing up for the patient portal called "MyChart".  Sign up information is provided on this After Visit Summary.  MyChart is used to connect with patients for Virtual Visits (Telemedicine).  Patients are able to view lab/test results, encounter notes, upcoming appointments, etc.  Non-urgent messages can be sent to your provider as well.   To learn more about what you can do with MyChart, go to [email or web address].    Your next appointment:   6 month(s)  The format for your next  appointment:   In Person  Provider:   Jyl Heinz, MD   Other Instructions NA

## 2021-07-07 ENCOUNTER — Other Ambulatory Visit: Payer: Self-pay

## 2021-07-07 ENCOUNTER — Ambulatory Visit: Payer: BC Managed Care – PPO

## 2021-07-07 VITALS — BP 152/78 | HR 62 | Ht 64.6 in | Wt 178.0 lb

## 2021-07-07 DIAGNOSIS — E782 Mixed hyperlipidemia: Secondary | ICD-10-CM | POA: Diagnosis not present

## 2021-07-07 DIAGNOSIS — I7090 Unspecified atherosclerosis: Secondary | ICD-10-CM | POA: Diagnosis not present

## 2021-07-07 DIAGNOSIS — I700 Atherosclerosis of aorta: Secondary | ICD-10-CM | POA: Diagnosis not present

## 2021-07-07 DIAGNOSIS — I10 Essential (primary) hypertension: Secondary | ICD-10-CM

## 2021-07-07 DIAGNOSIS — E559 Vitamin D deficiency, unspecified: Secondary | ICD-10-CM | POA: Diagnosis not present

## 2021-07-07 NOTE — Progress Notes (Signed)
Reason for visit: Patient here for labs, blood pressure and heart rate check after restarting spirinolactone  Name of MD requesting visit: Dr. Geraldo Pitter H&P: Patient here for labs, blood pressure and heart rate check after restarting spirinolactone  ROS related to problem: Patient here for blood pressure checks  Assessment and plan per MD: Blood pressures are good. Continue present management

## 2021-07-08 LAB — CBC WITH DIFFERENTIAL/PLATELET
Basophils Absolute: 0.1 10*3/uL (ref 0.0–0.2)
Basos: 2 %
EOS (ABSOLUTE): 0.2 10*3/uL (ref 0.0–0.4)
Eos: 4 %
Hematocrit: 41.5 % (ref 34.0–46.6)
Hemoglobin: 13.8 g/dL (ref 11.1–15.9)
Immature Grans (Abs): 0 10*3/uL (ref 0.0–0.1)
Immature Granulocytes: 0 %
Lymphocytes Absolute: 2.2 10*3/uL (ref 0.7–3.1)
Lymphs: 43 %
MCH: 29.7 pg (ref 26.6–33.0)
MCHC: 33.3 g/dL (ref 31.5–35.7)
MCV: 89 fL (ref 79–97)
Monocytes Absolute: 0.5 10*3/uL (ref 0.1–0.9)
Monocytes: 11 %
Neutrophils Absolute: 1.9 10*3/uL (ref 1.4–7.0)
Neutrophils: 40 %
Platelets: 231 10*3/uL (ref 150–450)
RBC: 4.65 x10E6/uL (ref 3.77–5.28)
RDW: 12.7 % (ref 11.7–15.4)
WBC: 4.9 10*3/uL (ref 3.4–10.8)

## 2021-07-08 LAB — HEPATIC FUNCTION PANEL
ALT: 19 IU/L (ref 0–32)
AST: 16 IU/L (ref 0–40)
Albumin: 4.7 g/dL (ref 3.8–4.8)
Alkaline Phosphatase: 87 IU/L (ref 44–121)
Bilirubin Total: 1.3 mg/dL — ABNORMAL HIGH (ref 0.0–1.2)
Bilirubin, Direct: 0.28 mg/dL (ref 0.00–0.40)
Total Protein: 6.5 g/dL (ref 6.0–8.5)

## 2021-07-08 LAB — BASIC METABOLIC PANEL
BUN/Creatinine Ratio: 19 (ref 12–28)
BUN: 15 mg/dL (ref 8–27)
CO2: 25 mmol/L (ref 20–29)
Calcium: 9.7 mg/dL (ref 8.7–10.3)
Chloride: 104 mmol/L (ref 96–106)
Creatinine, Ser: 0.8 mg/dL (ref 0.57–1.00)
Glucose: 91 mg/dL (ref 70–99)
Potassium: 4.6 mmol/L (ref 3.5–5.2)
Sodium: 142 mmol/L (ref 134–144)
eGFR: 84 mL/min/{1.73_m2} (ref >59)

## 2021-07-08 LAB — TSH: TSH: 0.665 u[IU]/mL (ref 0.450–4.500)

## 2021-07-08 LAB — LIPID PANEL
Chol/HDL Ratio: 2.3 ratio (ref 0.0–4.4)
Cholesterol, Total: 156 mg/dL (ref 100–199)
HDL: 67 mg/dL (ref >39)
LDL Chol Calc (NIH): 69 mg/dL (ref 0–99)
Triglycerides: 112 mg/dL (ref 0–149)
VLDL Cholesterol Cal: 20 mg/dL (ref 5–40)

## 2021-07-08 LAB — VITAMIN D 25 HYDROXY (VIT D DEFICIENCY, FRACTURES): Vit D, 25-Hydroxy: 34.2 ng/mL (ref 30.0–100.0)

## 2021-07-08 MED ORDER — ROSUVASTATIN CALCIUM 10 MG PO TABS: 10.0000 mg | ORAL_TABLET | Freq: Every day | ORAL | 3 refills | Status: DC

## 2021-07-08 NOTE — Addendum Note (Signed)
Addended by: Darlyn Repsher, Jonelle Sidle L on: 07/08/2021 12:26 PM   Modules accepted: Orders

## 2021-07-21 DIAGNOSIS — N9089 Other specified noninflammatory disorders of vulva and perineum: Secondary | ICD-10-CM | POA: Diagnosis not present

## 2021-07-21 DIAGNOSIS — Z6830 Body mass index (BMI) 30.0-30.9, adult: Secondary | ICD-10-CM | POA: Diagnosis not present

## 2021-07-28 ENCOUNTER — Encounter: Payer: Self-pay | Admitting: Internal Medicine

## 2021-07-28 NOTE — Progress Notes (Signed)
Outside notes received. Information abstracted. Notes sent to scan.  

## 2021-08-26 DIAGNOSIS — E782 Mixed hyperlipidemia: Secondary | ICD-10-CM | POA: Diagnosis not present

## 2021-08-26 DIAGNOSIS — I700 Atherosclerosis of aorta: Secondary | ICD-10-CM | POA: Diagnosis not present

## 2021-08-27 LAB — HEPATIC FUNCTION PANEL
ALT: 21 IU/L (ref 0–32)
AST: 20 IU/L (ref 0–40)
Albumin: 4.5 g/dL (ref 3.8–4.8)
Alkaline Phosphatase: 81 IU/L (ref 44–121)
Bilirubin Total: 1.2 mg/dL (ref 0.0–1.2)
Bilirubin, Direct: 0.29 mg/dL (ref 0.00–0.40)
Total Protein: 6.6 g/dL (ref 6.0–8.5)

## 2021-08-27 LAB — LIPID PANEL
Chol/HDL Ratio: 2.6 ratio (ref 0.0–4.4)
Cholesterol, Total: 156 mg/dL (ref 100–199)
HDL: 61 mg/dL (ref >39)
LDL Chol Calc (NIH): 78 mg/dL (ref 0–99)
Triglycerides: 94 mg/dL (ref 0–149)
VLDL Cholesterol Cal: 17 mg/dL (ref 5–40)

## 2021-10-04 ENCOUNTER — Other Ambulatory Visit: Payer: Self-pay

## 2021-10-04 MED ORDER — ROSUVASTATIN CALCIUM 20 MG PO TABS: 20.0000 mg | ORAL_TABLET | Freq: Every day | ORAL | 1 refills | Status: DC

## 2021-10-04 NOTE — Telephone Encounter (Signed)
Rx refill sent to pharmacy.  ? ?Left message on pt cell phone per DPR that new strength of Crestor has been sent to pharmacy. ?

## 2021-10-04 NOTE — Telephone Encounter (Signed)
Pt calling leaving a message requesting a refill on rosuvastatin 10 mg tablets, pt taking 2 tablets daily. Pt stated that Dr. Geraldo Pitter told her to double the medication rosuvastatin 10 mg totaling 20 mg daily. This change is not on the pt's medication list and the pharmacy will not pay for it because it is not changed on the Rx. Pt would like a call back concerning this matter. Please address ?

## 2021-12-29 ENCOUNTER — Ambulatory Visit (INDEPENDENT_AMBULATORY_CARE_PROVIDER_SITE_OTHER): Payer: BC Managed Care – PPO | Admitting: Cardiology

## 2021-12-29 ENCOUNTER — Encounter: Payer: Self-pay | Admitting: Cardiology

## 2021-12-29 VITALS — BP 146/96 | HR 64 | Ht 64.6 in | Wt 181.8 lb

## 2021-12-29 DIAGNOSIS — I700 Atherosclerosis of aorta: Secondary | ICD-10-CM

## 2021-12-29 DIAGNOSIS — I7090 Unspecified atherosclerosis: Secondary | ICD-10-CM | POA: Diagnosis not present

## 2021-12-29 DIAGNOSIS — Z131 Encounter for screening for diabetes mellitus: Secondary | ICD-10-CM

## 2021-12-29 DIAGNOSIS — R002 Palpitations: Secondary | ICD-10-CM | POA: Diagnosis not present

## 2021-12-29 DIAGNOSIS — E559 Vitamin D deficiency, unspecified: Secondary | ICD-10-CM

## 2021-12-29 DIAGNOSIS — E782 Mixed hyperlipidemia: Secondary | ICD-10-CM | POA: Diagnosis not present

## 2021-12-29 DIAGNOSIS — G4733 Obstructive sleep apnea (adult) (pediatric): Secondary | ICD-10-CM | POA: Diagnosis not present

## 2021-12-29 DIAGNOSIS — I10 Essential (primary) hypertension: Secondary | ICD-10-CM | POA: Diagnosis not present

## 2021-12-29 MED ORDER — FUROSEMIDE 20 MG PO TABS: 20.0000 mg | ORAL_TABLET | Freq: Every day | ORAL | 0 refills | Status: DC | PRN

## 2021-12-29 NOTE — Addendum Note (Signed)
Addended by: Truddie Hidden on: 12/29/2021 08:34 AM   Modules accepted: Orders
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
## 2021-12-29 NOTE — Patient Instructions (Addendum)
Medication Instructions:  Your physician has recommended you make the following change in your medication:   Take Lasix 20 mg as needed.  *If you need a refill on your cardiac medications before your next appointment, please call your pharmacy*   Lab Work: Your physician recommends that you have labs done in the office today. Your test included  basic metabolic panel, complete blood count, TSH, vitamin D, A1C, liver function and lipids.  If you have labs (blood work) drawn today and your tests are completely normal, you will receive your results only by: [place] (if you have MyChart) OR A paper copy in the mail If you have any lab test that is abnormal or we need to change your treatment, we will call you to review the results.   Testing/Procedures: None ordered   Follow-Up: At [facility], you and your health needs are our priority.  As part of our continuing mission to provide you with exceptional heart care, we have created designated Provider Care Teams.  These Care Teams include your primary Cardiologist (physician) and Advanced Practice Providers (APPs -  Physician Assistants and Nurse Practitioners) who all work together to provide you with the care you need, when you need it.  We recommend signing up for the patient portal called "MyChart".  Sign up information is provided on this After Visit Summary.  MyChart is used to connect with patients for Virtual Visits (Telemedicine).  Patients are able to view lab/test results, encounter notes, upcoming appointments, etc.  Non-urgent messages can be sent to your provider as well.   To learn more about what you can do with MyChart, go to [email or web address].    Your next appointment:   9 month(s)  The format for your next appointment:   In Person  Provider:   Jyl Heinz, MD   Other Instructions NA

## 2021-12-30 LAB — LIPID PANEL
Chol/HDL Ratio: 2.2 ratio (ref 0.0–4.4)
Cholesterol, Total: 146 mg/dL (ref 100–199)
HDL: 65 mg/dL (ref >39)
LDL Chol Calc (NIH): 64 mg/dL (ref 0–99)
Triglycerides: 92 mg/dL (ref 0–149)
VLDL Cholesterol Cal: 17 mg/dL (ref 5–40)

## 2021-12-30 LAB — CBC
Hematocrit: 39.8 % (ref 34.0–46.6)
Hemoglobin: 13.7 g/dL (ref 11.1–15.9)
MCH: 30.5 pg (ref 26.6–33.0)
MCHC: 34.4 g/dL (ref 31.5–35.7)
MCV: 89 fL (ref 79–97)
Platelets: 230 10*3/uL (ref 150–450)
RBC: 4.49 x10E6/uL (ref 3.77–5.28)
RDW: 13.1 % (ref 11.7–15.4)
WBC: 5 10*3/uL (ref 3.4–10.8)

## 2021-12-30 LAB — BASIC METABOLIC PANEL
BUN/Creatinine Ratio: 19 (ref 12–28)
BUN: 13 mg/dL (ref 8–27)
CO2: 24 mmol/L (ref 20–29)
Calcium: 9.3 mg/dL (ref 8.7–10.3)
Chloride: 105 mmol/L (ref 96–106)
Creatinine, Ser: 0.7 mg/dL (ref 0.57–1.00)
Glucose: 96 mg/dL (ref 70–99)
Potassium: 4.2 mmol/L (ref 3.5–5.2)
Sodium: 142 mmol/L (ref 134–144)
eGFR: 98 mL/min/{1.73_m2} (ref >59)

## 2021-12-30 LAB — TSH: TSH: 0.666 u[IU]/mL (ref 0.450–4.500)

## 2021-12-30 LAB — HEPATIC FUNCTION PANEL
ALT: 20 IU/L (ref 0–32)
AST: 22 IU/L (ref 0–40)
Albumin: 4.5 g/dL (ref 3.9–4.9)
Alkaline Phosphatase: 80 IU/L (ref 44–121)
Bilirubin Total: 1.3 mg/dL — ABNORMAL HIGH (ref 0.0–1.2)
Bilirubin, Direct: 0.28 mg/dL (ref 0.00–0.40)
Total Protein: 6.6 g/dL (ref 6.0–8.5)

## 2021-12-30 LAB — HEMOGLOBIN A1C
Est. average glucose Bld gHb Est-mCnc: 111 mg/dL
Hgb A1c MFr Bld: 5.5 % (ref 4.8–5.6)

## 2021-12-30 LAB — VITAMIN D 25 HYDROXY (VIT D DEFICIENCY, FRACTURES): Vit D, 25-Hydroxy: 32.6 ng/mL (ref 30.0–100.0)

## 2022-02-19 IMAGING — CT CT ABD-PELV W/ CM
2 of 5 series · 15 of 46 positions shown, 17 images · IV contrast (OMNIPAQUE 300)
Comparison: Ultrasound abdomen 03/17/2016, CT abdomen 05/12/2003

CLINICAL DATA: Right lower quadrant abdominal pain. Intermittent
right lower quadrant pain.

EXAM:
CT ABDOMEN AND PELVIS WITH CONTRAST
TECHNIQUE: Multidetector CT imaging of the abdomen and pelvis was performed
using the standard protocol following bolus administration of
intravenous contrast.
CONTRAST:  100mL OMNIPAQUE IOHEXOL 300 MG/ML  SOLN

[Series 2: abd/pel w · axial · 0.60mm/px · z∈[+1084,+1509]mm · 12 of 97 slices shown, 14 images]
[im 6/97  soft-tissue]
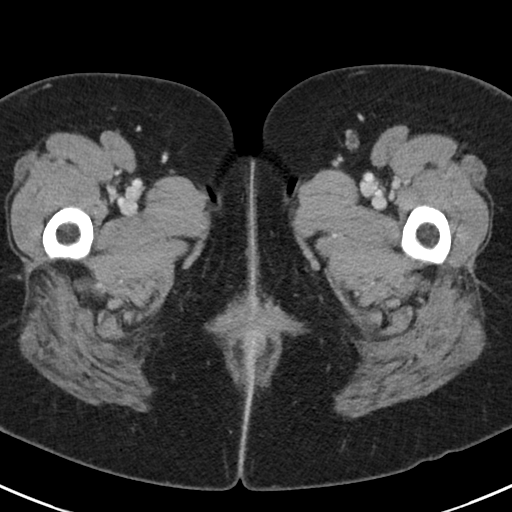
[im 6/97  bone]
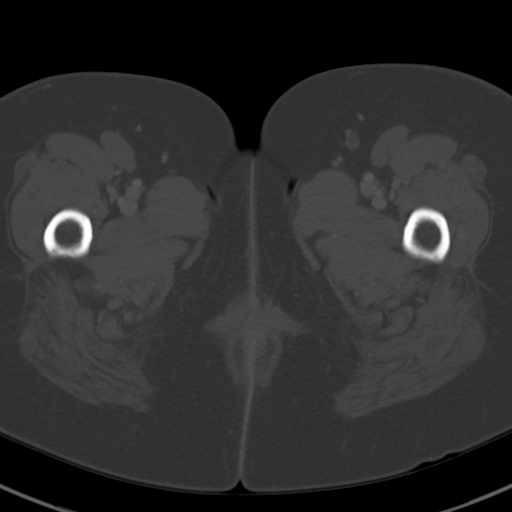
[im 16/97  soft-tissue]
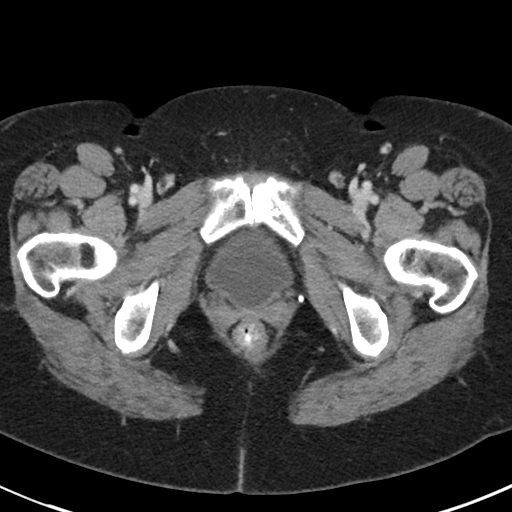
[im 21/97  soft-tissue]
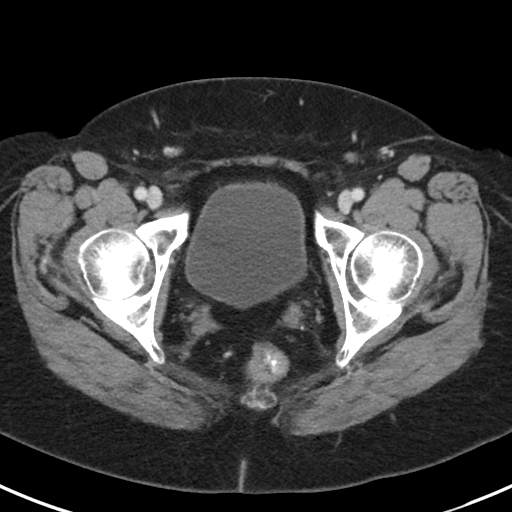
[im 31/97  soft-tissue]
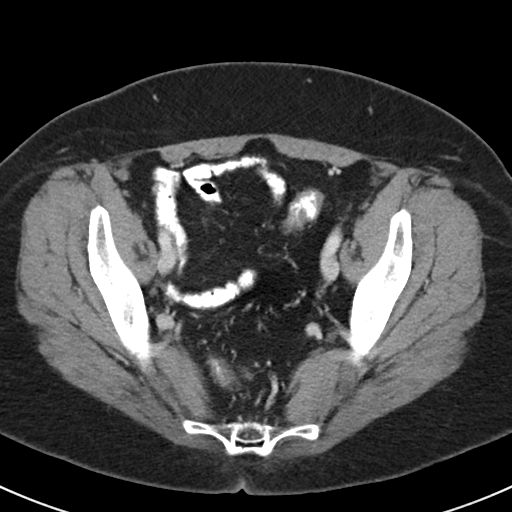
[im 36/97  soft-tissue]
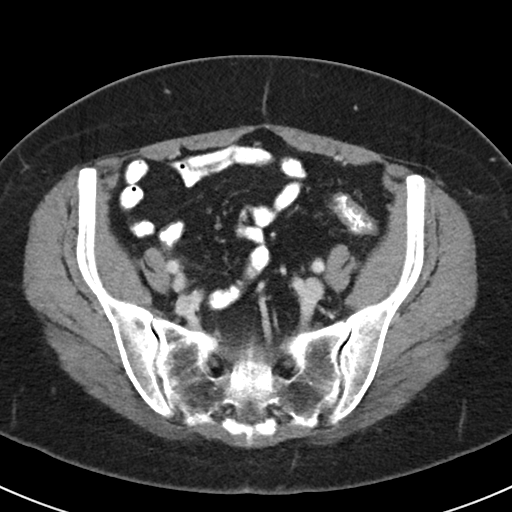
[im 46/97  soft-tissue]
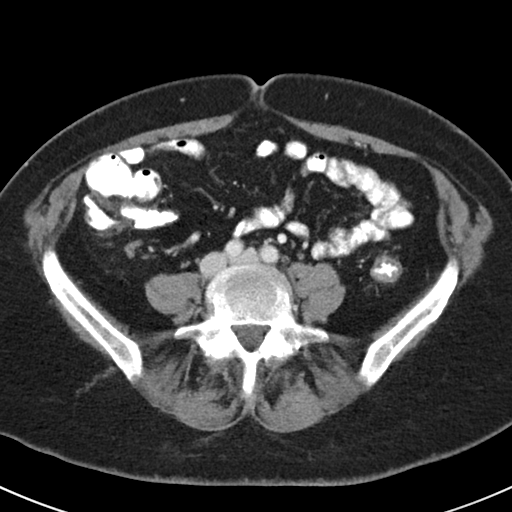
[im 51/97  soft-tissue]
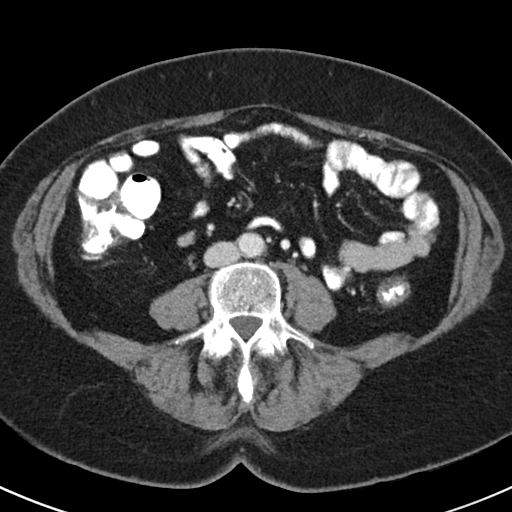
[im 61/97  soft-tissue]
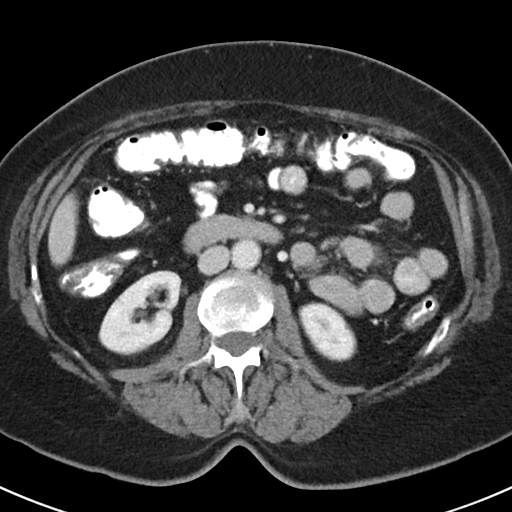
[im 66/97  soft-tissue]
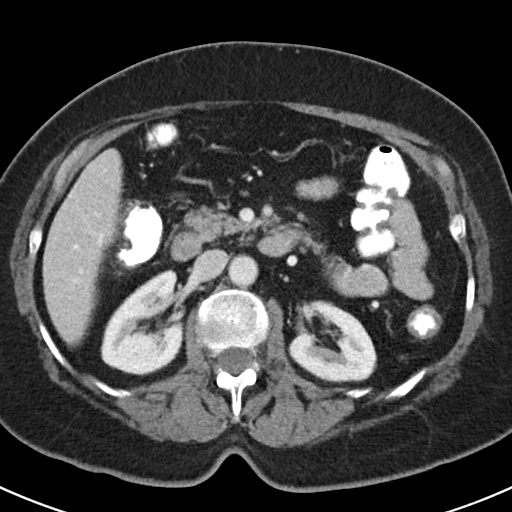
[im 66/97  bone]
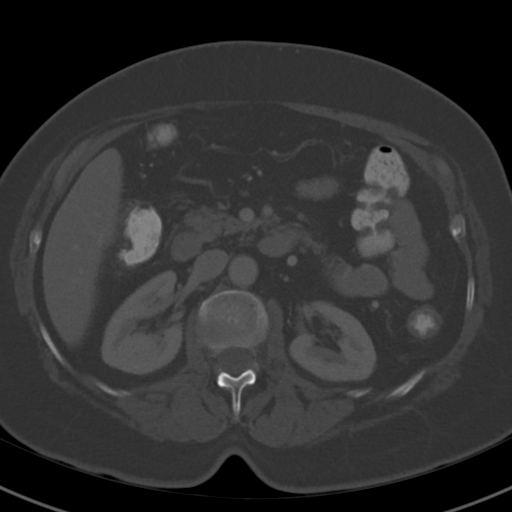
[im 76/97  soft-tissue]
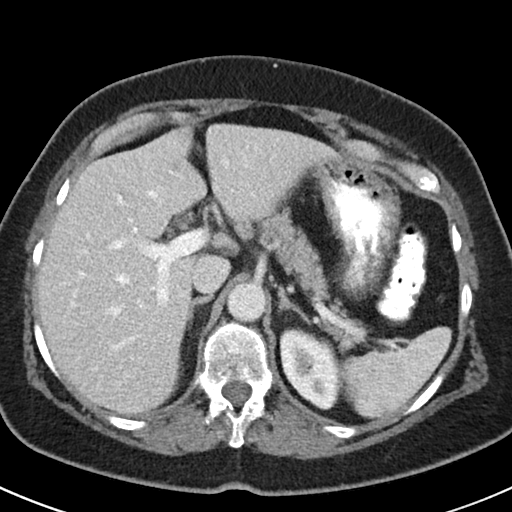
[im 81/97  soft-tissue]
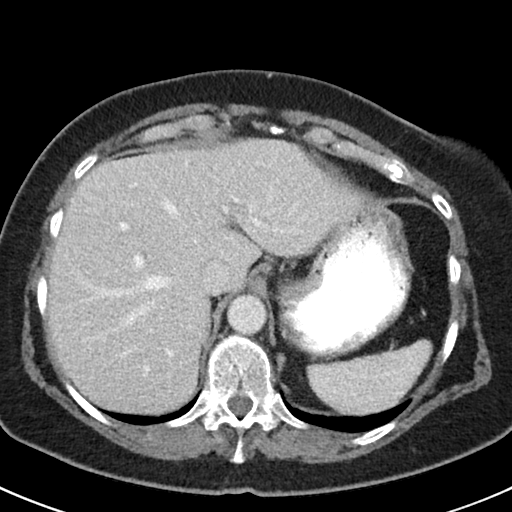
[im 91/97  soft-tissue]
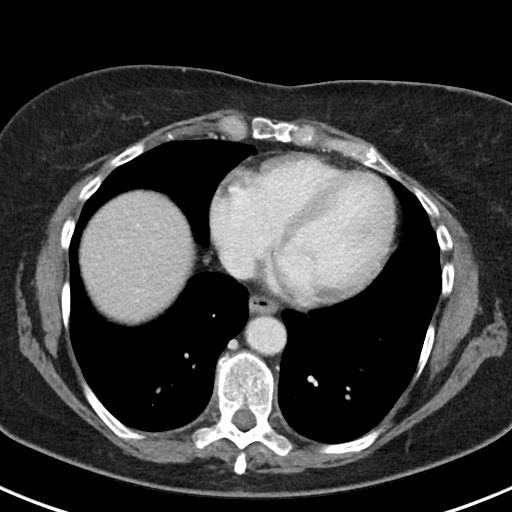

[Series 5: coronal st · coronal · 0.66mm/px · 3 of 92 slices shown]
[im 31/92  soft-tissue]
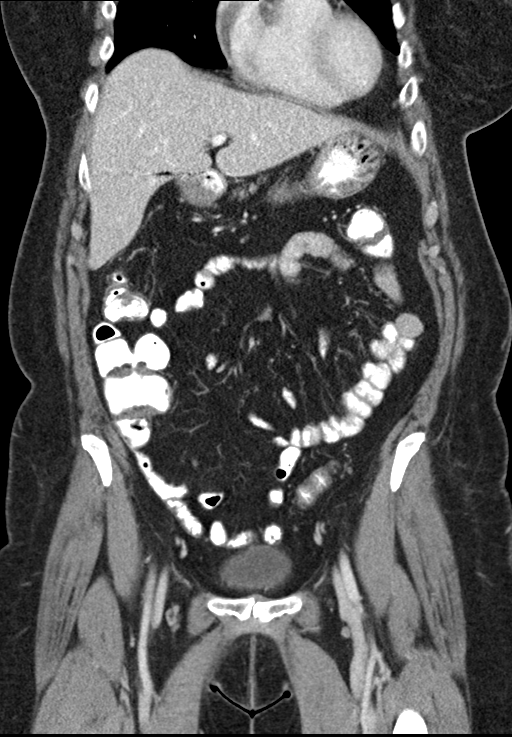
[im 41/92  soft-tissue]
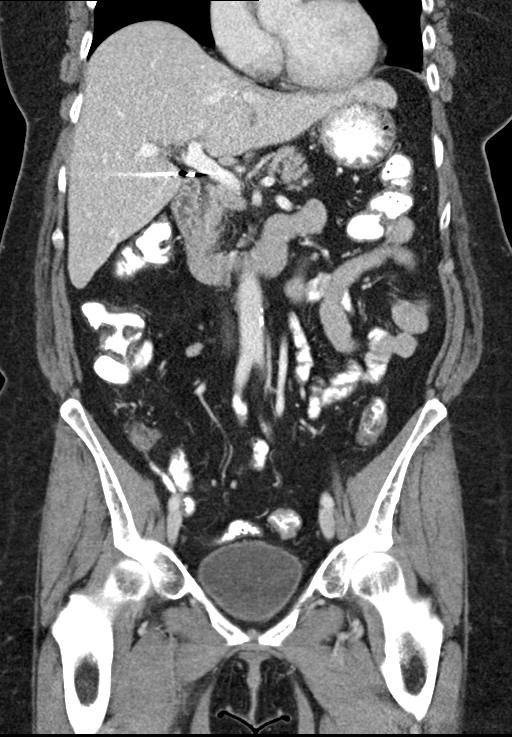
[im 51/92  soft-tissue]
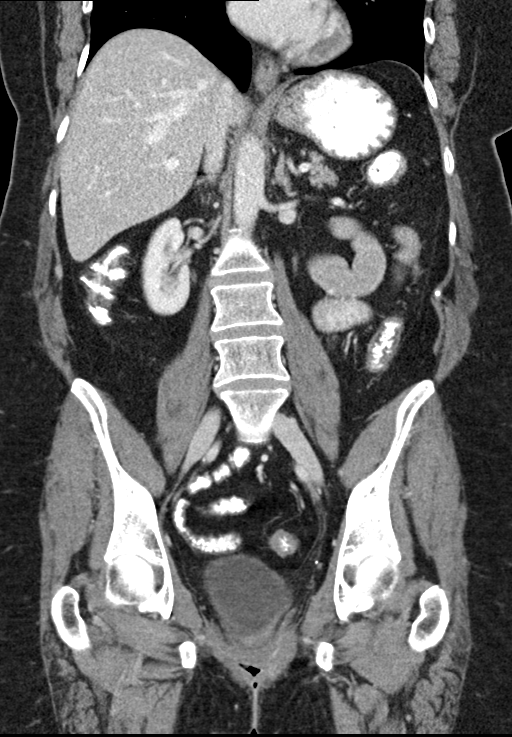

[15 of 46 positions shown; findings below may reference images not displayed]

FINDINGS: Lower chest: Bilateral lower lobe subsegmental atelectasis.

Hepatobiliary: No focal liver abnormality is seen. Status post
cholecystectomy. No biliary dilatation.

Pancreas: No focal lesion. Normal pancreatic contour. No surrounding
inflammatory changes. No main pancreatic ductal dilatation.

Spleen: Normal in size without focal abnormality.

Adrenals/Urinary Tract:

No adrenal nodule bilaterally.

Bilateral kidneys enhance symmetrically. Subcentimeter hypodensity
within the kidney is too small to characterize. No hydronephrosis.
No hydroureter.

The urinary bladder is unremarkable.

Stomach/Bowel: PO contrast reaches the rectum. Stomach is within
normal limits. No evidence of bowel wall thickening or dilatation.
The descending colon and rectosigmoid colon are decompressed.
Appendix appears normal.

Vascular/Lymphatic: No abdominal aorta or iliac aneurysm. Mild
atherosclerotic plaque of the aorta and its branches. No abdominal,
pelvic, or inguinal lymphadenopathy.

Reproductive: Status post hysterectomy. No adnexal masses.

Other: No intraperitoneal free fluid. No intraperitoneal free gas.
No organized fluid collection.

Musculoskeletal: No acute or significant osseous findings.
IMPRESSION: 1. No acute intra-abdominal or intrapelvic abnormality.
2.  Aortic Atherosclerosis ([id number]-N45.5).

## 2022-03-21 ENCOUNTER — Other Ambulatory Visit: Payer: Self-pay | Admitting: Cardiology

## 2022-03-22 DIAGNOSIS — H25813 Combined forms of age-related cataract, bilateral: Secondary | ICD-10-CM | POA: Diagnosis not present

## 2022-03-22 DIAGNOSIS — Z01818 Encounter for other preprocedural examination: Secondary | ICD-10-CM | POA: Diagnosis not present

## 2022-04-03 DIAGNOSIS — H18452 Nodular corneal degeneration, left eye: Secondary | ICD-10-CM | POA: Diagnosis not present

## 2022-04-03 DIAGNOSIS — H18592 Other hereditary corneal dystrophies, left eye: Secondary | ICD-10-CM | POA: Diagnosis not present

## 2022-04-05 DIAGNOSIS — H18452 Nodular corneal degeneration, left eye: Secondary | ICD-10-CM | POA: Diagnosis not present

## 2022-04-05 DIAGNOSIS — H1849 Other corneal degeneration: Secondary | ICD-10-CM | POA: Diagnosis not present

## 2022-04-12 ENCOUNTER — Other Ambulatory Visit: Payer: Self-pay | Admitting: Cardiology

## 2022-05-15 DIAGNOSIS — H25813 Combined forms of age-related cataract, bilateral: Secondary | ICD-10-CM | POA: Diagnosis not present

## 2022-05-22 DIAGNOSIS — H2512 Age-related nuclear cataract, left eye: Secondary | ICD-10-CM | POA: Diagnosis not present

## 2022-05-22 DIAGNOSIS — H269 Unspecified cataract: Secondary | ICD-10-CM | POA: Diagnosis not present

## 2022-05-22 DIAGNOSIS — H52202 Unspecified astigmatism, left eye: Secondary | ICD-10-CM | POA: Diagnosis not present

## 2022-05-25 DIAGNOSIS — H25812 Combined forms of age-related cataract, left eye: Secondary | ICD-10-CM | POA: Diagnosis not present

## 2022-05-31 DIAGNOSIS — H25811 Combined forms of age-related cataract, right eye: Secondary | ICD-10-CM | POA: Diagnosis not present

## 2022-05-31 DIAGNOSIS — H2511 Age-related nuclear cataract, right eye: Secondary | ICD-10-CM | POA: Diagnosis not present

## 2022-05-31 DIAGNOSIS — H52201 Unspecified astigmatism, right eye: Secondary | ICD-10-CM | POA: Diagnosis not present

## 2022-05-31 DIAGNOSIS — H269 Unspecified cataract: Secondary | ICD-10-CM | POA: Diagnosis not present

## 2022-06-30 DIAGNOSIS — H04123 Dry eye syndrome of bilateral lacrimal glands: Secondary | ICD-10-CM | POA: Diagnosis not present

## 2022-07-05 DIAGNOSIS — B309 Viral conjunctivitis, unspecified: Secondary | ICD-10-CM | POA: Diagnosis not present

## 2022-07-26 DIAGNOSIS — H04123 Dry eye syndrome of bilateral lacrimal glands: Secondary | ICD-10-CM | POA: Diagnosis not present

## 2022-10-19 DIAGNOSIS — Z01419 Encounter for gynecological examination (general) (routine) without abnormal findings: Secondary | ICD-10-CM | POA: Diagnosis not present

## 2022-10-19 DIAGNOSIS — Z6830 Body mass index (BMI) 30.0-30.9, adult: Secondary | ICD-10-CM | POA: Diagnosis not present

## 2022-10-19 DIAGNOSIS — Z1231 Encounter for screening mammogram for malignant neoplasm of breast: Secondary | ICD-10-CM | POA: Diagnosis not present

## 2022-12-22 DIAGNOSIS — H04123 Dry eye syndrome of bilateral lacrimal glands: Secondary | ICD-10-CM | POA: Diagnosis not present

## 2022-12-25 DIAGNOSIS — L900 Lichen sclerosus et atrophicus: Secondary | ICD-10-CM | POA: Diagnosis not present

## 2023-01-17 NOTE — Progress Notes (Signed)
Cardiology Office Note:  .   Date:  01/18/2023  ID:  Penny Valdez, DOB 1961/05/01, MRN [id number] PCP: Pincus Sanes, MD  [facility] Providers Cardiologist:  None    History of Present Illness: .   Penny Valdez is a 62 y.o. female with a past medical history of hypertension, aortic atherosclerosis, migraines, OSA, GERD, history of uterine cancer, hyperlipidemia.  10/19/20 echocardiogram EF 60 to 65%, mild aortic valve sclerosis without stenosis 09/16/2020 carotid ultrasound no stenosis noted  Most recently evaluated on 12/29/2021 by Dr. Tomie China, she was complaining of dizziness and had stopped her spironolactone.  She was given Lasix for pedal edema to be used as needed.  She presents today for follow-up of her hypertension and aortic atherosclerosis.  She is most bothered by the inability to lose weight, in fact she has gained approximately 6 pounds since she was evaluated here last year.  She does not do any formal exercising, admits that she has not really made any dietary changes.  She does endorse 1 episode approximately 6 months ago where she woke up in the middle of the night with chest pain that radiated up to her jaw, thought it was acid reflux however was not relieved with Tums.  She has not had any further episodes since then.  This episode has mixed features, could be GERD although could be angina.  We did discuss possibly repeating a coronary CTA however she did not think that was necessary at this time. She denies chest pain, palpitations, dyspnea, pnd, orthopnea, n, v, dizziness, syncope, edema, weight gain, or early satiety.   ROS: Review of Systems  Constitutional: Negative.   HENT: Negative.    Eyes: Negative.   Respiratory: Negative.    Gastrointestinal: Negative.   Genitourinary: Negative.   Musculoskeletal: Negative.   Skin: Negative.   Neurological: Negative.   Endo/Heme/Allergies: Negative.   Psychiatric/Behavioral: Negative.       Studies Reviewed: Marland Kitchen    EKG Interpretation Date/Time:  Thursday January 18 2023 08:43:21 EDT Ventricular Rate:  59 PR Interval:  164 QRS Duration:  84 QT Interval:  418 QTC Calculation: 413 R Axis:   39  Text Interpretation: Sinus bradycardia Otherwise normal ECG No previous ECGs available Confirmed by Wallis Bamberg [phone]) on 01/18/2023 8:57:07 AM    Cardiac Studies & Procedures       ECHOCARDIOGRAM  ECHOCARDIOGRAM COMPLETE 10/19/2020  Narrative ECHOCARDIOGRAM REPORT    Patient Name:   Penny Valdez Date of Exam: 10/19/2020 Medical Rec #:  [id number]      Height:       64.0 in Accession #:    [id number]     Weight:       176.0 lb Date of Birth:  1960-08-29       BSA:          1.853 m Patient Age:    60 years       BP:           132/74 mmHg Patient Gender: F              HR:           68 bpm. Exam Location:  [address]  Procedure: 2D Echo, Cardiac Doppler, Color Doppler and Intracardiac Opacification Agent  Indications:    R00.2 Palpitation  History:        Patient has no prior history of Echocardiogram examinations. Risk Factors:Hypertension, Sleep Apnea and Dyslipidemia. LE edema. Palpitation.  Sonographer:  Garald Braver, [place] Referring Phys: [id number] [facility] J BURNS   Sonographer Comments: Technically difficult study due to poor echo windows and suboptimal apical window. Image acquisition challenging due to patient body habitus. IMPRESSIONS   1. Left ventricular ejection fraction, by estimation, is 60 to 65%. The left ventricle has normal function. The left ventricle has no regional wall motion abnormalities. Left ventricular diastolic parameters were normal. 2. Right ventricular systolic function is normal. The right ventricular size is normal. There is normal pulmonary artery systolic pressure. The estimated right ventricular systolic pressure is 28.4 mmHg. 3. The mitral valve is normal in structure. Trivial mitral valve regurgitation. 4. The aortic valve is tricuspid. Aortic valve  regurgitation is not visualized. Mild aortic valve sclerosis is present, with no evidence of aortic valve stenosis. 5. The inferior vena cava is normal in size with greater than 50% respiratory variability, suggesting right atrial pressure of 3 mmHg.  FINDINGS Left Ventricle: Left ventricular ejection fraction, by estimation, is 60 to 65%. The left ventricle has normal function. The left ventricle has no regional wall motion abnormalities. Definity contrast agent was given IV to delineate the left ventricular endocardial borders. The left ventricular internal cavity size was normal in size. There is no left ventricular hypertrophy. Left ventricular diastolic parameters were normal.  Right Ventricle: The right ventricular size is normal. No increase in right ventricular wall thickness. Right ventricular systolic function is normal. There is normal pulmonary artery systolic pressure. The tricuspid regurgitant velocity is 2.52 m/s, and with an assumed right atrial pressure of 3 mmHg, the estimated right ventricular systolic pressure is 28.4 mmHg.  Left Atrium: Left atrial size was normal in size.  Right Atrium: Right atrial size was normal in size.  Pericardium: There is no evidence of pericardial effusion.  Mitral Valve: The mitral valve is normal in structure. Trivial mitral valve regurgitation.  Tricuspid Valve: The tricuspid valve is normal in structure. Tricuspid valve regurgitation is trivial.  Aortic Valve: The aortic valve is tricuspid. Aortic valve regurgitation is not visualized. Mild aortic valve sclerosis is present, with no evidence of aortic valve stenosis.  Pulmonic Valve: The pulmonic valve was not well visualized. Pulmonic valve regurgitation is not visualized.  Aorta: The aortic root and ascending aorta are structurally normal, with no evidence of dilitation.  Venous: The inferior vena cava is normal in size with greater than 50% respiratory variability, suggesting right  atrial pressure of 3 mmHg.  IAS/Shunts: No atrial level shunt detected by color flow Doppler.   LEFT VENTRICLE PLAX 2D LVIDd:         4.50 cm  Diastology LVIDs:         3.00 cm  LV e' medial:    6.97 cm/s LV PW:         0.80 cm  LV E/e' medial:  14.5 LV IVS:        0.90 cm  LV e' lateral:   8.92 cm/s LVOT diam:     2.00 cm  LV E/e' lateral: 11.3 LV SV:         83 LV SV Index:   45 LVOT Area:     3.14 cm   RIGHT VENTRICLE RV Basal diam:  2.80 cm RV S prime:     11.40 cm/s TAPSE (M-mode): 1.8 cm RVSP:           28.4 mmHg  LEFT ATRIUM             Index  RIGHT ATRIUM           Index LA diam:        3.60 cm 1.94 cm/m  RA Pressure: 3.00 mmHg LA Vol (A2C):   30.8 ml 16.62 ml/m RA Area:     11.10 cm LA Vol (A4C):   35.2 ml 19.00 ml/m RA Volume:   24.50 ml  13.22 ml/m LA Biplane Vol: 33.6 ml 18.13 ml/m AORTIC VALVE LVOT Vmax:   112.00 cm/s LVOT Vmean:  73.100 cm/s LVOT VTI:    0.264 m  AORTA Ao Root diam: 2.90 cm Ao Asc diam:  3.20 cm  MITRAL VALVE                TRICUSPID VALVE TR Peak grad:   25.4 mmHg TR Vmax:        252.00 cm/s MV E velocity: 101.00 cm/s  Estimated RAP:  3.00 mmHg MV A velocity: 87.30 cm/s   RVSP:           28.4 mmHg MV E/A ratio:  1.16 SHUNTS Systemic VTI:  0.26 m Systemic Diam: 2.00 cm  Epifanio Lesches MD Electronically signed by Epifanio Lesches MD Signature Date/Time: 10/19/2020/11:16:25 AM    Final             Risk Assessment/Calculations:             Physical Exam:   VS:  BP 124/80 (BP Location: Left Arm, Patient Position: Sitting, Cuff Size: Normal)   Pulse 63   Ht 5\' 5"  (1.651 m)   Wt 187 lb (84.8 kg)   SpO2 96%   BMI 31.12 kg/m    Wt Readings from Last 3 Encounters:  01/18/23 187 lb (84.8 kg)  12/29/21 181 lb 12.8 oz (82.5 kg)  07/07/21 178 lb (80.7 kg)    GEN: Well nourished, well developed in no acute distress NECK: No JVD; No carotid bruits CARDIAC: RRR, no murmurs, rubs, gallops RESPIRATORY:   Clear to auscultation without rales, wheezing or rhonchi  ABDOMEN: Soft, non-tender, non-distended EXTREMITIES:  No edema; No deformity   ASSESSMENT AND PLAN: .   Aortic atherosclerosis-this is noted on CT of the abdomen in 2023.  She did have 1 episode as outlined above in the HPI, could be angina could have been acid reflux.  Offered coronary CTA however she did not feel it was necessary at this time. Stable with no anginal symptoms. No indication for ischemic evaluation.  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  Advised her we could start aspirin however she deferred that at this time as well. Pedal edema/venous insufficiency-had previously been on spironolactone however this was discontinued secondary to dizziness.  She has Lasix that she days as needed as needed for pedal edema, approximately 1-2 times per month.  We discussed elevation, rest, and exercise to help with this. Hyperlipidemia-most recent LDL was well-controlled at 64, will repeat FLP and LFTs today. Obesity-BMI is 31, has been difficult for her to lose weight, she request that we check her thyroid today to see if this could be contributing to her seemingly inexplicable weight gain. Hypertension-blood pressure is well-controlled at 124/80, not currently on any antihypertensive agents.   Dispo: FLP LFTs today, will check TSH.  Return in 6 months.  Signed, Flossie Dibble, NP

## 2023-01-18 ENCOUNTER — Ambulatory Visit: Payer: BC Managed Care – PPO | Attending: Cardiology | Admitting: Cardiology

## 2023-01-18 ENCOUNTER — Encounter: Payer: Self-pay | Admitting: Cardiology

## 2023-01-18 VITALS — BP 124/80 | HR 63 | Ht 65.0 in | Wt 187.0 lb

## 2023-01-18 DIAGNOSIS — I10 Essential (primary) hypertension: Secondary | ICD-10-CM

## 2023-01-18 DIAGNOSIS — I700 Atherosclerosis of aorta: Secondary | ICD-10-CM

## 2023-01-18 DIAGNOSIS — R002 Palpitations: Secondary | ICD-10-CM

## 2023-01-18 DIAGNOSIS — E669 Obesity, unspecified: Secondary | ICD-10-CM | POA: Diagnosis not present

## 2023-01-18 DIAGNOSIS — R600 Localized edema: Secondary | ICD-10-CM

## 2023-01-18 LAB — TSH+T4F+T3FREE
Free T4: 1.19 ng/dL (ref 0.82–1.77)
T3, Free: 3.3 pg/mL (ref 2.0–4.4)
TSH: 0.92 u[IU]/mL (ref 0.450–4.500)

## 2023-01-18 LAB — LIPID PANEL
Chol/HDL Ratio: 2.1 ratio (ref 0.0–4.4)
Cholesterol, Total: 151 mg/dL (ref 100–199)
HDL: 72 mg/dL
LDL Chol Calc (NIH): 61 mg/dL (ref 0–99)
Triglycerides: 98 mg/dL (ref 0–149)
VLDL Cholesterol Cal: 18 mg/dL (ref 5–40)

## 2023-01-18 LAB — HEPATIC FUNCTION PANEL
ALT: 24 IU/L (ref 0–32)
AST: 26 IU/L (ref 0–40)
Albumin: 4.4 g/dL (ref 3.9–4.9)
Alkaline Phosphatase: 91 IU/L (ref 44–121)
Bilirubin Total: 1.3 mg/dL — ABNORMAL HIGH (ref 0.0–1.2)
Bilirubin, Direct: 0.32 mg/dL (ref 0.00–0.40)
Total Protein: 6.5 g/dL (ref 6.0–8.5)

## 2023-01-18 MED ORDER — ROSUVASTATIN CALCIUM 20 MG PO TABS: 20.0000 mg | ORAL_TABLET | Freq: Every day | ORAL | 3 refills | Status: DC

## 2023-01-18 NOTE — Patient Instructions (Signed)
Medication Instructions:  Your physician recommends that you continue on your current medications as directed. Please refer to the Current Medication list given to you today.  *If you need a refill on your cardiac medications before your next appointment, please call your pharmacy*   Lab Work: Your physician recommends that you return for lab work in:   Labs today: TSH T3 T4, LFT, Lipids  If you have labs (blood work) drawn today and your tests are completely normal, you will receive your results only by: MyChart Message (if you have MyChart) OR A paper copy in the mail If you have any lab test that is abnormal or we need to change your treatment, we will call you to review the results.   Testing/Procedures: None   Follow-Up: At [facility], you and your health needs are our priority.  As part of our continuing mission to provide you with exceptional heart care, we have created designated Provider Care Teams.  These Care Teams include your primary Cardiologist (physician) and Advanced Practice Providers (APPs -  Physician Assistants and Nurse Practitioners) who all work together to provide you with the care you need, when you need it.  We recommend signing up for the patient portal called "MyChart".  Sign up information is provided on this After Visit Summary.  MyChart is used to connect with patients for Virtual Visits (Telemedicine).  Patients are able to view lab/test results, encounter notes, upcoming appointments, etc.  Non-urgent messages can be sent to your provider as well.   To learn more about what you can do with MyChart, go to [email or web address].    Your next appointment:   6 month(s)  Provider:   Belva Crome, MD    Other Instructions None

## 2023-01-20 ENCOUNTER — Other Ambulatory Visit: Payer: Self-pay | Admitting: Cardiology

## 2023-02-06 ENCOUNTER — Telehealth: Payer: Self-pay | Admitting: Cardiology

## 2023-02-06 ENCOUNTER — Other Ambulatory Visit: Payer: Self-pay

## 2023-02-06 MED ORDER — ROSUVASTATIN CALCIUM 20 MG PO TABS: 20.0000 mg | ORAL_TABLET | Freq: Every day | ORAL | 3 refills | Status: DC

## 2023-02-06 NOTE — Telephone Encounter (Signed)
*  STAT* If patient is at the pharmacy, call can be transferred to refill team.   1. Which medications need to be refilled? (please list name of each medication and dose if known)   rosuvastatin (CRESTOR) 20 MG tablet   2. Would you like to learn more about the convenience, safety, & potential cost savings by using the [facility]?   3. Are you open to using the [facility] (Type [facility]. ).  4. Which pharmacy/location (including street and city if local pharmacy) is medication to be sent to?  [id number] - [place], [place] - [address]   5. Do they need a 30 day or 90 day supply?   90 day  Patient stated this medication was not received by her pharmacy - [id number] - [place],  - [address] and she is completely out of this medication.

## 2023-02-13 DIAGNOSIS — H04123 Dry eye syndrome of bilateral lacrimal glands: Secondary | ICD-10-CM | POA: Diagnosis not present

## 2023-07-26 ENCOUNTER — Ambulatory Visit: Payer: BC Managed Care – PPO | Attending: Cardiology | Admitting: Cardiology

## 2023-07-26 ENCOUNTER — Encounter: Payer: Self-pay | Admitting: Cardiology

## 2023-07-26 VITALS — BP 160/100 | HR 63 | Ht 65.0 in | Wt 189.0 lb

## 2023-07-26 DIAGNOSIS — E782 Mixed hyperlipidemia: Secondary | ICD-10-CM

## 2023-07-26 DIAGNOSIS — I700 Atherosclerosis of aorta: Secondary | ICD-10-CM | POA: Diagnosis not present

## 2023-07-26 DIAGNOSIS — I10 Essential (primary) hypertension: Secondary | ICD-10-CM

## 2023-07-26 DIAGNOSIS — E663 Overweight: Secondary | ICD-10-CM | POA: Diagnosis not present

## 2023-07-26 DIAGNOSIS — Z79899 Other long term (current) drug therapy: Secondary | ICD-10-CM

## 2023-07-26 MED ORDER — AMLODIPINE BESYLATE 5 MG PO TABS: 5.0000 mg | ORAL_TABLET | Freq: Every day | ORAL | 3 refills | Status: DC

## 2023-07-26 NOTE — Patient Instructions (Signed)
Medication Instructions:  Your physician has recommended you make the following change in your medication:  Start Amlodipine 5 mg once daily  *If you need a refill on your cardiac medications before your next appointment, please call your pharmacy*   Lab Work: Your physician recommends that you return for lab work in: Today for a BMP and CBC  If you have labs (blood work) drawn today and your tests are completely normal, you will receive your results only by: MyChart Message (if you have MyChart) OR A paper copy in the mail If you have any lab test that is abnormal or we need to change your treatment, we will call you to review the results.   Testing/Procedures: NONE   Follow-Up: At [facility], you and your health needs are our priority.  As part of our continuing mission to provide you with exceptional heart care, we have created designated Provider Care Teams.  These Care Teams include your primary Cardiologist (physician) and Advanced Practice Providers (APPs -  Physician Assistants and Nurse Practitioners) who all work together to provide you with the care you need, when you need it.  We recommend signing up for the patient portal called "MyChart".  Sign up information is provided on this After Visit Summary.  MyChart is used to connect with patients for Virtual Visits (Telemedicine).  Patients are able to view lab/test results, encounter notes, upcoming appointments, etc.  Non-urgent messages can be sent to your provider as well.   To learn more about what you can do with MyChart, go to [email or web address].    Your next appointment:   3 month(s)  Provider:   Wallis Bamberg, NP [facility])    Other Instructions Check and record BP two times daily, once in the morning 1-2 hours after taking medication and once in the evening before dinner. You can drop off log at our office or send it through MyChart.

## 2023-07-26 NOTE — Progress Notes (Signed)
 Cardiology Office Note:  .   Date:  07/27/2023  ID:  David Stall, DOB 05/21/1961, MRN [id number] PCP: Pincus Sanes, MD  [facility] Providers Cardiologist:  Garwin Brothers, MD    History of Present Illness: .   Penny Valdez is a 63 y.o. female with a past medical history of hypertension, aortic atherosclerosis, migraines, OSA, GERD, history of uterine cancer, hyperlipidemia.  10/19/20 echocardiogram EF 60 to 65%, mild aortic valve sclerosis without stenosis 09/16/2020 carotid ultrasound no stenosis noted  Evaluated on 12/29/2021 by Dr. Tomie China, she was complaining of dizziness and had stopped her spironolactone.  She was given Lasix for pedal edema to be used as needed.  Evaluated 01/30/2023 for follow-up of her hypertension, she is most bothered by the inability to lose weight and weight gain, 85-month follow-up  advised.   She presents today for follow-up of her hypertension.  She has noticed that her blood pressure has been steadily increasing.  Her weight is also steadily increasing, but she is not volume overloaded.  She denies chest pain, palpitations, dyspnea, pnd, orthopnea, n, v, dizziness, syncope, edema, weight gain, or early satiety.   ROS: Review of Systems  Constitutional: Negative.   HENT: Negative.    Eyes: Negative.   Respiratory: Negative.    Gastrointestinal: Negative.   Genitourinary: Negative.   Musculoskeletal: Negative.   Skin: Negative.   Neurological: Negative.   Endo/Heme/Allergies: Negative.   Psychiatric/Behavioral: Negative.       Studies Reviewed: .        Cardiac Studies & Procedures   ______________________________________________________________________________________________     ECHOCARDIOGRAM  ECHOCARDIOGRAM COMPLETE 10/19/2020  Narrative ECHOCARDIOGRAM REPORT    Patient Name:   Penny Valdez Date of Exam: 10/19/2020 Medical Rec #:  [id number]      Height:       64.0 in Accession #:    [id number]     Weight:       176.0  lb Date of Birth:  1961-01-17       BSA:          1.853 m Patient Age:    60 years       BP:           132/74 mmHg Patient Gender: F              HR:           68 bpm. Exam Location:  [address]  Procedure: 2D Echo, Cardiac Doppler, Color Doppler and Intracardiac Opacification Agent  Indications:    R00.2 Palpitation  History:        Patient has no prior history of Echocardiogram examinations. Risk Factors:Hypertension, Sleep Apnea and Dyslipidemia. LE edema. Palpitation.  Sonographer:    Garald Braver, RDCS Referring Phys: [id number] [facility] J BURNS   Sonographer Comments: Technically difficult study due to poor echo windows and suboptimal apical window. Image acquisition challenging due to patient body habitus. IMPRESSIONS   1. Left ventricular ejection fraction, by estimation, is 60 to 65%. The left ventricle has normal function. The left ventricle has no regional wall motion abnormalities. Left ventricular diastolic parameters were normal. 2. Right ventricular systolic function is normal. The right ventricular size is normal. There is normal pulmonary artery systolic pressure. The estimated right ventricular systolic pressure is 28.4 mmHg. 3. The mitral valve is normal in structure. Trivial mitral valve regurgitation. 4. The aortic valve is tricuspid. Aortic valve regurgitation is not visualized. Mild aortic valve sclerosis is present,  with no evidence of aortic valve stenosis. 5. The inferior vena cava is normal in size with greater than 50% respiratory variability, suggesting right atrial pressure of 3 mmHg.  FINDINGS Left Ventricle: Left ventricular ejection fraction, by estimation, is 60 to 65%. The left ventricle has normal function. The left ventricle has no regional wall motion abnormalities. Definity contrast agent was given IV to delineate the left ventricular endocardial borders. The left ventricular internal cavity size was normal in size. There is no left ventricular  hypertrophy. Left ventricular diastolic parameters were normal.  Right Ventricle: The right ventricular size is normal. No increase in right ventricular wall thickness. Right ventricular systolic function is normal. There is normal pulmonary artery systolic pressure. The tricuspid regurgitant velocity is 2.52 m/s, and with an assumed right atrial pressure of 3 mmHg, the estimated right ventricular systolic pressure is 28.4 mmHg.  Left Atrium: Left atrial size was normal in size.  Right Atrium: Right atrial size was normal in size.  Pericardium: There is no evidence of pericardial effusion.  Mitral Valve: The mitral valve is normal in structure. Trivial mitral valve regurgitation.  Tricuspid Valve: The tricuspid valve is normal in structure. Tricuspid valve regurgitation is trivial.  Aortic Valve: The aortic valve is tricuspid. Aortic valve regurgitation is not visualized. Mild aortic valve sclerosis is present, with no evidence of aortic valve stenosis.  Pulmonic Valve: The pulmonic valve was not well visualized. Pulmonic valve regurgitation is not visualized.  Aorta: The aortic root and ascending aorta are structurally normal, with no evidence of dilitation.  Venous: The inferior vena cava is normal in size with greater than 50% respiratory variability, suggesting right atrial pressure of 3 mmHg.  IAS/Shunts: No atrial level shunt detected by color flow Doppler.   LEFT VENTRICLE PLAX 2D LVIDd:         4.50 cm  Diastology LVIDs:         3.00 cm  LV e' medial:    6.97 cm/s LV PW:         0.80 cm  LV E/e' medial:  14.5 LV IVS:        0.90 cm  LV e' lateral:   8.92 cm/s LVOT diam:     2.00 cm  LV E/e' lateral: 11.3 LV SV:         83 LV SV Index:   45 LVOT Area:     3.14 cm   RIGHT VENTRICLE RV Basal diam:  2.80 cm RV S prime:     11.40 cm/s TAPSE (M-mode): 1.8 cm RVSP:           28.4 mmHg  LEFT ATRIUM             Index       RIGHT ATRIUM           Index LA diam:        3.60  cm 1.94 cm/m  RA Pressure: 3.00 mmHg LA Vol (A2C):   30.8 ml 16.62 ml/m RA Area:     11.10 cm LA Vol (A4C):   35.2 ml 19.00 ml/m RA Volume:   24.50 ml  13.22 ml/m LA Biplane Vol: 33.6 ml 18.13 ml/m AORTIC VALVE LVOT Vmax:   112.00 cm/s LVOT Vmean:  73.100 cm/s LVOT VTI:    0.264 m  AORTA Ao Root diam: 2.90 cm Ao Asc diam:  3.20 cm  MITRAL VALVE                TRICUSPID VALVE TR  Peak grad:   25.4 mmHg TR Vmax:        252.00 cm/s MV E velocity: 101.00 cm/s  Estimated RAP:  3.00 mmHg MV A velocity: 87.30 cm/s   RVSP:           28.4 mmHg MV E/A ratio:  1.16 SHUNTS Systemic VTI:  0.26 m Systemic Diam: 2.00 cm  Epifanio Lesches MD Electronically signed by Epifanio Lesches MD Signature Date/Time: 10/19/2020/11:16:25 AM    Final          ______________________________________________________________________________________________      Risk Assessment/Calculations:     HYPERTENSION CONTROL Vitals:   07/26/23 1548 07/27/23 1226  BP: (!) 160/100 (!) 160/100    The patient's blood pressure is elevated above target today.  In order to address the patient's elevated BP: Blood pressure will be monitored at home to determine if medication changes need to be made.; A new medication was prescribed today.          Physical Exam:   VS:  BP (!) 160/100   Pulse 63   Ht 5\' 5"  (1.651 m)   Wt 189 lb (85.7 kg)   SpO2 95%   BMI 31.45 kg/m    Wt Readings from Last 3 Encounters:  07/26/23 189 lb (85.7 kg)  01/18/23 187 lb (84.8 kg)  12/29/21 181 lb 12.8 oz (82.5 kg)    GEN: Well nourished, well developed in no acute distress NECK: No JVD; No carotid bruits CARDIAC: RRR, no murmurs, rubs, gallops RESPIRATORY:  Clear to auscultation without rales, wheezing or rhonchi  ABDOMEN: Soft, non-tender, non-distended EXTREMITIES:  No edema; No deformity   ASSESSMENT AND PLAN: .   Hypertension-her blood pressure is very elevated today at 160/100, yesterday was also  elevated at 189/102.  Will start her on amlodipine 5 mg daily, blood pressure log for 2 weeks..  Will repeat BMET, CBC.  Aortic atherosclerosis-this is noted on CT of the abdomen in 2023. Stable with no anginal symptoms. No indication for ischemic evaluation.  Relatively sedentary, encouraged increased physical activity and work on weight loss.  Pedal edema/venous insufficiency-had previously been on spironolactone however this was discontinued secondary to dizziness.  Takes Lasix as needed for pedal edema.  Hyperlipidemia-most recent LDL was well-controlled at 64.  Continue Crestor 20 mg daily.  Obesity-BMI is 31, has been difficult for her to lose weight, deviously checked her thyroid which was noncontributory.  Will refer her to healthy weight and wellness.   Dispo: Start amlodipine 5 mg daily.  Blood pressure log for 2 weeks, referred to healthy weight and wellness CBC BMET.   Signed, Flossie Dibble, NP

## 2023-07-27 ENCOUNTER — Encounter: Payer: Self-pay | Admitting: Cardiology

## 2023-07-27 LAB — BASIC METABOLIC PANEL WITH GFR
BUN/Creatinine Ratio: 21 (ref 12–28)
BUN: 15 mg/dL (ref 8–27)
CO2: 25 mmol/L (ref 20–29)
Calcium: 9.7 mg/dL (ref 8.7–10.3)
Chloride: 103 mmol/L (ref 96–106)
Creatinine, Ser: 0.71 mg/dL (ref 0.57–1.00)
Glucose: 85 mg/dL (ref 70–99)
Potassium: 4.6 mmol/L (ref 3.5–5.2)
Sodium: 142 mmol/L (ref 134–144)
eGFR: 95 mL/min/1.73

## 2023-07-27 LAB — CBC WITH DIFFERENTIAL/PLATELET
Basophils Absolute: 0.1 x10E3/uL (ref 0.0–0.2)
Basos: 2 %
EOS (ABSOLUTE): 0.2 x10E3/uL (ref 0.0–0.4)
Eos: 2 %
Hematocrit: 41.6 % (ref 34.0–46.6)
Hemoglobin: 13.7 g/dL (ref 11.1–15.9)
Immature Grans (Abs): 0 x10E3/uL (ref 0.0–0.1)
Immature Granulocytes: 0 %
Lymphocytes Absolute: 2.9 x10E3/uL (ref 0.7–3.1)
Lymphs: 43 %
MCH: 30 pg (ref 26.6–33.0)
MCHC: 32.9 g/dL (ref 31.5–35.7)
MCV: 91 fL (ref 79–97)
Monocytes Absolute: 0.6 x10E3/uL (ref 0.1–0.9)
Monocytes: 9 %
Neutrophils Absolute: 3 x10E3/uL (ref 1.4–7.0)
Neutrophils: 44 %
Platelets: 235 x10E3/uL (ref 150–450)
RBC: 4.56 x10E6/uL (ref 3.77–5.28)
RDW: 13 % (ref 11.7–15.4)
WBC: 6.8 x10E3/uL (ref 3.4–10.8)

## 2023-07-30 ENCOUNTER — Encounter: Payer: Self-pay | Admitting: Internal Medicine

## 2023-07-30 NOTE — Progress Notes (Unsigned)
Subjective:    Patient ID: MICKELLE GOUPIL, female    DOB: April 11, 1961, 62 y.o.   MRN: [id number]      HPI Penny Valdez is here for a Physical exam and her chronic medical problems.   ? labs   Medications and allergies reviewed with patient and updated if appropriate.  Current Outpatient Medications on File Prior to Visit  Medication Sig Dispense Refill   amLODipine (NORVASC) 5 MG tablet Take 1 tablet (5 mg total) by mouth daily. 90 tablet 3   furosemide (LASIX) 20 MG tablet TAKE 1 TABLET (20 MG TOTAL) BY MOUTH DAILY AS NEEDED FOR EDEMA. 90 tablet 1   Lansoprazole (PREVACID PO) Take 1 tablet by mouth daily.     MIEBO 1.338 GM/ML SOLN Place 1 drop into both eyes 2 (two) times daily.     rosuvastatin (CRESTOR) 20 MG tablet Take 1 tablet (20 mg total) by mouth daily. 90 tablet 3   No current facility-administered medications on file prior to visit.    Review of Systems     Objective:  There were no vitals filed for this visit. There were no vitals filed for this visit. There is no height or weight on file to calculate BMI.  BP Readings from Last 3 Encounters:  07/27/23 (!) 160/100  01/18/23 124/80  12/29/21 (!) 146/96    Wt Readings from Last 3 Encounters:  07/26/23 189 lb (85.7 kg)  01/18/23 187 lb (84.8 kg)  12/29/21 181 lb 12.8 oz (82.5 kg)       Physical Exam Constitutional: She appears well-developed and well-nourished. No distress.  HENT:  Head: Normocephalic and atraumatic.  Right Ear: External ear normal. Normal ear canal and TM Left Ear: External ear normal.  Normal ear canal and TM Mouth/Throat: Oropharynx is clear and moist.  Eyes: Conjunctivae normal.  Neck: Neck supple. No tracheal deviation present. No thyromegaly present.  No carotid bruit  Cardiovascular: Normal rate, regular rhythm and normal heart sounds.   No murmur heard.  No edema. Pulmonary/Chest: Effort normal and breath sounds normal. No respiratory distress. She has no wheezes. She has no  rales.  Breast: deferred   Abdominal: Soft. She exhibits no distension. There is no tenderness.  Lymphadenopathy: She has no cervical adenopathy.  Skin: Skin is warm and dry. She is not diaphoretic.  Psychiatric: She has a normal mood and affect. Her behavior is normal.     Lab Results  Component Value Date   WBC 6.8 07/26/2023   HGB 13.7 07/26/2023   HCT 41.6 07/26/2023   PLT 235 07/26/2023   GLUCOSE 85 07/26/2023   CHOL 151 01/18/2023   TRIG 98 01/18/2023   HDL 72 01/18/2023   LDLDIRECT 127.1 07/02/2012   LDLCALC 61 01/18/2023   ALT 24 01/18/2023   AST 26 01/18/2023   NA 142 07/26/2023   K 4.6 07/26/2023   CL 103 07/26/2023   CREATININE 0.71 07/26/2023   BUN 15 07/26/2023   CO2 25 07/26/2023   TSH 0.920 01/18/2023   HGBA1C 5.5 12/29/2021         Assessment & Plan:   Physical exam: Screening blood work  ordered Exercise   Weight   Substance abuse  none   Reviewed recommended immunizations.   Health Maintenance  Topic Date Due   COVID-19 Vaccine (3 - Moderna risk series) 10/21/2019   DTaP/Tdap/Td (2 - Tdap) 05/03/2020   INFLUENZA VACCINE  Never done   MAMMOGRAM  06/29/2023   Colonoscopy  10/24/2025  Hepatitis C Screening  Completed   HIV Screening  Completed   Zoster Vaccines- Shingrix  Completed   HPV VACCINES  Aged Out          See Problem List for Assessment and Plan of chronic medical problems.

## 2023-07-30 NOTE — Patient Instructions (Addendum)
Dermatologist specialists     Medications changes include :   None     Return in about 1 year (around 07/30/2024) for Physical Exam.    Health Maintenance, Female Adopting a healthy lifestyle and getting preventive care are important in promoting health and wellness. Ask your health care provider about: The right schedule for you to have regular tests and exams. Things you can do on your own to prevent diseases and keep yourself healthy. What should I know about diet, weight, and exercise? Eat a healthy diet  Eat a diet that includes plenty of vegetables, fruits, low-fat dairy products, and lean protein. Do not eat a lot of foods that are high in solid fats, added sugars, or sodium. Maintain a healthy weight Body mass index (BMI) is used to identify weight problems. It estimates body fat based on height and weight. Your health care provider can help determine your BMI and help you achieve or maintain a healthy weight. Get regular exercise Get regular exercise. This is one of the most important things you can do for your health. Most adults should: Exercise for at least 150 minutes each week. The exercise should increase your heart rate and make you sweat (moderate-intensity exercise). Do strengthening exercises at least twice a week. This is in addition to the moderate-intensity exercise. Spend less time sitting. Even light physical activity can be beneficial. Watch cholesterol and blood lipids Have your blood tested for lipids and cholesterol at 64 years of age, then have this test every 5 years. Have your cholesterol levels checked more often if: Your lipid or cholesterol levels are high. You are older than 64 years of age. You are at high risk for heart disease. What should I know about cancer screening? Depending on your health history and family history, you may need to have cancer screening at various ages. This may include screening for: Breast cancer. Cervical  cancer. Colorectal cancer. Skin cancer. Lung cancer. What should I know about heart disease, diabetes, and high blood pressure? Blood pressure and heart disease High blood pressure causes heart disease and increases the risk of stroke. This is more likely to develop in people who have high blood pressure readings or are overweight. Have your blood pressure checked: Every 3-5 years if you are 52-4 years of age. Every year if you are 70 years old or older. Diabetes Have regular diabetes screenings. This checks your fasting blood sugar level. Have the screening done: Once every three years after age 16 if you are at a normal weight and have a low risk for diabetes. More often and at a younger age if you are overweight or have a high risk for diabetes. What should I know about preventing infection? Hepatitis B If you have a higher risk for hepatitis B, you should be screened for this virus. Talk with your health care provider to find out if you are at risk for hepatitis B infection. Hepatitis C Testing is recommended for: Everyone born from [birth year] through 1966. Anyone with known risk factors for hepatitis C. Sexually transmitted infections (STIs) Get screened for STIs, including gonorrhea and chlamydia, if: You are sexually active and are younger than 64 years of age. You are older than 64 years of age and your health care provider tells you that you are at risk for this type of infection. Your sexual activity has changed since you were last screened, and you are at increased risk for chlamydia or gonorrhea. Ask your health care provider if  you are at risk. Ask your health care provider about whether you are at high risk for HIV. Your health care provider may recommend a prescription medicine to help prevent HIV infection. If you choose to take medicine to prevent HIV, you should first get tested for HIV. You should then be tested every 3 months for as long as you are taking the  medicine. Pregnancy If you are about to stop having your period (premenopausal) and you may become pregnant, seek counseling before you get pregnant. Take 400 to 800 micrograms (mcg) of folic acid every day if you become pregnant. Ask for birth control (contraception) if you want to prevent pregnancy. Osteoporosis and menopause Osteoporosis is a disease in which the bones lose minerals and strength with aging. This can result in bone fractures. If you are 74 years old or older, or if you are at risk for osteoporosis and fractures, ask your health care provider if you should: Be screened for bone loss. Take a calcium or vitamin D supplement to lower your risk of fractures. Be given hormone replacement therapy (HRT) to treat symptoms of menopause. Follow these instructions at home: Alcohol use Do not drink alcohol if: Your health care provider tells you not to drink. You are pregnant, may be pregnant, or are planning to become pregnant. If you drink alcohol: Limit how much you have to: 0-1 drink a day. Know how much alcohol is in your drink. In the [place] one drink equals one 12 oz bottle of beer (355 mL), one 5 oz glass of wine (148 mL), or one 1 oz glass of hard liquor (44 mL). Lifestyle Do not use any products that contain nicotine or tobacco. These products include cigarettes, chewing tobacco, and vaping devices, such as e-cigarettes. If you need help quitting, ask your health care provider. Do not use street drugs. Do not share needles. Ask your health care provider for help if you need support or information about quitting drugs. General instructions Schedule regular health, dental, and eye exams. Stay current with your vaccines. Tell your health care provider if: You often feel depressed. You have ever been abused or do not feel safe at home. Summary Adopting a healthy lifestyle and getting preventive care are important in promoting health and wellness. Follow your health care  provider's instructions about healthy diet, exercising, and getting tested or screened for diseases. Follow your health care provider's instructions on monitoring your cholesterol and blood pressure. This information is not intended to replace advice given to you by your health care provider. Make sure you discuss any questions you have with your health care provider. Document Revised: 10/18/2020 Document Reviewed: 10/18/2020 Elsevier Patient Education  2024 [employer].

## 2023-07-31 ENCOUNTER — Ambulatory Visit (INDEPENDENT_AMBULATORY_CARE_PROVIDER_SITE_OTHER): Payer: BC Managed Care – PPO | Admitting: Internal Medicine

## 2023-07-31 VITALS — BP 130/80 | HR 75 | Temp 98.1°F | Ht 65.0 in | Wt 187.0 lb

## 2023-07-31 DIAGNOSIS — E559 Vitamin D deficiency, unspecified: Secondary | ICD-10-CM

## 2023-07-31 DIAGNOSIS — Z23 Encounter for immunization: Secondary | ICD-10-CM | POA: Diagnosis not present

## 2023-07-31 DIAGNOSIS — K219 Gastro-esophageal reflux disease without esophagitis: Secondary | ICD-10-CM | POA: Diagnosis not present

## 2023-07-31 DIAGNOSIS — M25551 Pain in right hip: Secondary | ICD-10-CM

## 2023-07-31 DIAGNOSIS — Z0000 Encounter for general adult medical examination without abnormal findings: Secondary | ICD-10-CM

## 2023-07-31 DIAGNOSIS — G4733 Obstructive sleep apnea (adult) (pediatric): Secondary | ICD-10-CM

## 2023-07-31 DIAGNOSIS — M8580 Other specified disorders of bone density and structure, unspecified site: Secondary | ICD-10-CM

## 2023-07-31 DIAGNOSIS — I10 Essential (primary) hypertension: Secondary | ICD-10-CM

## 2023-07-31 DIAGNOSIS — E782 Mixed hyperlipidemia: Secondary | ICD-10-CM

## 2023-07-31 NOTE — Assessment & Plan Note (Signed)
Chronic GERD controlled Work on diet, weight loss Continue lansoprazole daily

## 2023-07-31 NOTE — Assessment & Plan Note (Signed)
Taking vitamin d Continue supplementation

## 2023-07-31 NOTE — Assessment & Plan Note (Addendum)
Chronic Blood pressure has been elevated  - placed on medication last week Continue amlodipine 5 mg daily She will be monitoring at home and will update cardiology

## 2023-07-31 NOTE — Assessment & Plan Note (Signed)
Chronic Continue crestor 20 mg daily Stressed regular exercise, healthy diet and keeping her weight down

## 2023-08-03 ENCOUNTER — Other Ambulatory Visit: Payer: Self-pay | Admitting: Internal Medicine

## 2023-08-06 MED ORDER — MIEBO 1.338 GM/ML OP SOLN
1.0000 [drp] | Freq: Two times a day (BID) | OPHTHALMIC | 5 refills | Status: DC
  Filled 2023-08-06: qty 3, 30d supply, fill #0
  Filled 2023-08-16: qty 3, 15d supply, fill #0
  Filled 2023-10-01: qty 3, 15d supply, fill #1
  Filled 2023-11-19: qty 3, 15d supply, fill #2
  Filled 2023-12-31: qty 3, 30d supply, fill #3
  Filled 2024-01-26: qty 3, 30d supply, fill #4
  Filled 2024-04-05: qty 3, 30d supply, fill #5

## 2023-08-06 NOTE — Progress Notes (Unsigned)
    Aleen Sells D.[facility] [address] [place] [address] Phone: [phone]   Assessment and Plan:     There are no diagnoses linked to this encounter.  ***   Pertinent previous records reviewed include ***    Follow Up: ***     Subjective:   I, Rilley Poulter, am serving as a Neurosurgeon for Doctor Richardean Sale  Chief Complaint: right hip pain   HPI:   08/07/2023 Patient is a 63 year old female with right hip pain . Patient states   Relevant Historical Information: ***  Additional pertinent review of systems negative.   Current Outpatient Medications:    amLODipine (NORVASC) 5 MG tablet, Take 1 tablet (5 mg total) by mouth daily., Disp: 90 tablet, Rfl: 3   furosemide (LASIX) 20 MG tablet, TAKE 1 TABLET (20 MG TOTAL) BY MOUTH DAILY AS NEEDED FOR EDEMA., Disp: 90 tablet, Rfl: 1   Lansoprazole (PREVACID PO), Take 1 tablet by mouth daily., Disp: , Rfl:    MIEBO 1.338 GM/ML SOLN, Place 1 drop into both eyes 2 (two) times daily., Disp: , Rfl:    rosuvastatin (CRESTOR) 20 MG tablet, Take 1 tablet (20 mg total) by mouth daily., Disp: 90 tablet, Rfl: 3   Objective:     There were no vitals filed for this visit.    There is no height or weight on file to calculate BMI.    Physical Exam:    ***   Electronically signed by:  Aleen Sells D.[facility] 12:34 PM 08/06/23

## 2023-08-07 ENCOUNTER — Other Ambulatory Visit: Payer: Self-pay

## 2023-08-07 ENCOUNTER — Encounter: Payer: Self-pay | Admitting: Pharmacist

## 2023-08-07 ENCOUNTER — Ambulatory Visit (INDEPENDENT_AMBULATORY_CARE_PROVIDER_SITE_OTHER): Payer: BC Managed Care – PPO | Admitting: Sports Medicine

## 2023-08-07 ENCOUNTER — Other Ambulatory Visit (HOSPITAL_COMMUNITY): Payer: Self-pay

## 2023-08-07 VITALS — BP 132/80 | HR 75 | Ht 65.0 in | Wt 189.0 lb

## 2023-08-07 DIAGNOSIS — M7061 Trochanteric bursitis, right hip: Secondary | ICD-10-CM | POA: Diagnosis not present

## 2023-08-07 DIAGNOSIS — G8929 Other chronic pain: Secondary | ICD-10-CM

## 2023-08-07 DIAGNOSIS — M25551 Pain in right hip: Secondary | ICD-10-CM | POA: Diagnosis not present

## 2023-08-07 MED ORDER — MELOXICAM 15 MG PO TABS: 15.0000 mg | ORAL_TABLET | Freq: Every day | ORAL | 0 refills | Status: DC

## 2023-08-07 NOTE — Patient Instructions (Signed)
-   Start meloxicam 15 mg daily x2 weeks.  If still having pain after 2 weeks, complete 3rd-week of NSAID. May use remaining NSAID as needed once daily for pain control.  Do not to use additional over-the-counter NSAIDs (ibuprofen, naproxen, Advil, Aleve) while taking prescription NSAIDs.  May use Tylenol [phone] mg 2 to 3 times a day for breakthrough pain. Glute HEP  4 week follow up

## 2023-08-08 ENCOUNTER — Other Ambulatory Visit: Payer: Self-pay

## 2023-08-13 ENCOUNTER — Other Ambulatory Visit: Payer: Self-pay

## 2023-08-14 ENCOUNTER — Encounter: Payer: BC Managed Care – PPO | Admitting: Internal Medicine

## 2023-08-16 ENCOUNTER — Other Ambulatory Visit (HOSPITAL_COMMUNITY): Payer: Self-pay

## 2023-08-28 MED ORDER — TELMISARTAN-HCTZ 40-12.5 MG PO TABS: 1.0000 | ORAL_TABLET | Freq: Every day | ORAL | 3 refills | Status: AC

## 2023-09-04 ENCOUNTER — Ambulatory Visit: Payer: BC Managed Care – PPO | Admitting: Sports Medicine

## 2023-09-05 ENCOUNTER — Encounter (INDEPENDENT_AMBULATORY_CARE_PROVIDER_SITE_OTHER): Payer: Self-pay | Admitting: Physician Assistant

## 2023-09-13 ENCOUNTER — Encounter (INDEPENDENT_AMBULATORY_CARE_PROVIDER_SITE_OTHER): Payer: Self-pay

## 2023-10-01 ENCOUNTER — Other Ambulatory Visit: Payer: Self-pay

## 2023-10-25 ENCOUNTER — Ambulatory Visit: Payer: BC Managed Care – PPO | Admitting: Cardiology

## 2023-11-20 ENCOUNTER — Other Ambulatory Visit: Payer: Self-pay

## 2023-12-03 NOTE — Progress Notes (Signed)
 Cardiology Office Note:  .   Date:  12/04/2023  ID:  Penny Valdez, DOB 23-Sep-1960, MRN [id number] PCP: Penny Glade PARAS, MD  [facility] Providers Cardiologist:  Tiffancy Moger JONELLE Crape, MD    History of Present Illness: .   Penny Valdez is a 63 y.o. female with a past medical history of hypertension, aortic atherosclerosis, migraines, OSA, GERD, history of uterine cancer, hyperlipidemia.  10/19/20 echocardiogram EF 60 to 65%, mild aortic valve sclerosis without stenosis 09/16/2020 carotid ultrasound no stenosis noted  [facility] established with [facility] in 2022 for evaluation of palpitations and atherosclerotic vascular disease the request of her PCP.  She had a carotid duplex revealing no stenosis in 2022, echocardiogram at that time was largely normal with mild aortic valve sclerosis.  Most recently she was evaluated by myself on 07/26/2023, noticed that her blood pressure had been steadily increasing, also been dealing with weight gain, we started her on amlodipine  however she was bothered by pedal edema and this was subsequently discontinued and she was started on telmisartan  HCT 40-12.5 mg once daily.  She presents today for follow-up of her blood pressure, it is well-controlled today and has been well-controlled at home.  She is very bothered by fatigue, she says this been going on since she was started on Crestor  2 years ago.  She continues to work regularly however does not participate in any formal exercise.  She has some pedal edema and was previously prescribed Lasix  however it appears to be more vascular insufficiency.  She denies chest pain, palpitations, dyspnea, pnd, orthopnea, n, v, dizziness, syncope,  weight gain, or early satiety.    ROS: Review of Systems  Constitutional:  Positive for malaise/fatigue.  HENT: Negative.    Eyes: Negative.   Respiratory: Negative.    Gastrointestinal: Negative.   Genitourinary: Negative.   Musculoskeletal: Negative.   Skin: Negative.    Neurological: Negative.   Endo/Heme/Allergies: Negative.   Psychiatric/Behavioral: Negative.       Studies Reviewed: .        Cardiac Studies & Procedures   ______________________________________________________________________________________________     ECHOCARDIOGRAM  ECHOCARDIOGRAM COMPLETE 10/19/2020  Narrative ECHOCARDIOGRAM REPORT    Patient Name:   Penny Valdez Date of Exam: 10/19/2020 Medical Rec #:  [id number]      Height:       64.0 in Accession #:    [id number]     Weight:       176.0 lb Date of Birth:  [birth year]       BSA:          1.853 m Patient Age:    60 years       BP:           132/74 mmHg Patient Gender: F              HR:           68 bpm. Exam Location:  [address]  Procedure: 2D Echo, Cardiac Doppler, Color Doppler and Intracardiac Opacification Agent  Indications:    R00.2 Palpitation  History:        Patient has no prior history of Echocardiogram examinations. Risk Factors:Hypertension, Sleep Apnea and Dyslipidemia. LE edema. Palpitation.  Sonographer:    Marshia Rea RAMAN, RDCS Referring Phys: [id number] [facility] J BURNS   Sonographer Comments: Technically difficult study due to poor echo windows and suboptimal apical window. Image acquisition challenging due to patient body habitus. IMPRESSIONS   1. Left ventricular ejection fraction, by estimation,  is 60 to 65%. The left ventricle has normal function. The left ventricle has no regional wall motion abnormalities. Left ventricular diastolic parameters were normal. 2. Right ventricular systolic function is normal. The right ventricular size is normal. There is normal pulmonary artery systolic pressure. The estimated right ventricular systolic pressure is 28.4 mmHg. 3. The mitral valve is normal in structure. Trivial mitral valve regurgitation. 4. The aortic valve is tricuspid. Aortic valve regurgitation is not visualized. Mild aortic valve sclerosis is present, with no evidence of aortic valve  stenosis. 5. The inferior vena cava is normal in size with greater than 50% respiratory variability, suggesting right atrial pressure of 3 mmHg.  FINDINGS Left Ventricle: Left ventricular ejection fraction, by estimation, is 60 to 65%. The left ventricle has normal function. The left ventricle has no regional wall motion abnormalities. Definity  contrast agent was given IV to delineate the left ventricular endocardial borders. The left ventricular internal cavity size was normal in size. There is no left ventricular hypertrophy. Left ventricular diastolic parameters were normal.  Right Ventricle: The right ventricular size is normal. No increase in right ventricular wall thickness. Right ventricular systolic function is normal. There is normal pulmonary artery systolic pressure. The tricuspid regurgitant velocity is 2.52 m/s, and with an assumed right atrial pressure of 3 mmHg, the estimated right ventricular systolic pressure is 28.4 mmHg.  Left Atrium: Left atrial size was normal in size.  Right Atrium: Right atrial size was normal in size.  Pericardium: There is no evidence of pericardial effusion.  Mitral Valve: The mitral valve is normal in structure. Trivial mitral valve regurgitation.  Tricuspid Valve: The tricuspid valve is normal in structure. Tricuspid valve regurgitation is trivial.  Aortic Valve: The aortic valve is tricuspid. Aortic valve regurgitation is not visualized. Mild aortic valve sclerosis is present, with no evidence of aortic valve stenosis.  Pulmonic Valve: The pulmonic valve was not well visualized. Pulmonic valve regurgitation is not visualized.  Aorta: The aortic root and ascending aorta are structurally normal, with no evidence of dilitation.  Venous: The inferior vena cava is normal in size with greater than 50% respiratory variability, suggesting right atrial pressure of 3 mmHg.  IAS/Shunts: No atrial level shunt detected by color flow Doppler.   LEFT  VENTRICLE PLAX 2D LVIDd:         4.50 cm  Diastology LVIDs:         3.00 cm  LV e' medial:    6.97 cm/s LV PW:         0.80 cm  LV E/e' medial:  14.5 LV IVS:        0.90 cm  LV e' lateral:   8.92 cm/s LVOT diam:     2.00 cm  LV E/e' lateral: 11.3 LV SV:         83 LV SV Index:   45 LVOT Area:     3.14 cm   RIGHT VENTRICLE RV Basal diam:  2.80 cm RV S prime:     11.40 cm/s TAPSE (M-mode): 1.8 cm RVSP:           28.4 mmHg  LEFT ATRIUM             Index       RIGHT ATRIUM           Index LA diam:        3.60 cm 1.94 cm/m  RA Pressure: 3.00 mmHg LA Vol (A2C):   30.8 ml 16.62 ml/m RA Area:  11.10 cm LA Vol (A4C):   35.2 ml 19.00 ml/m RA Volume:   24.50 ml  13.22 ml/m LA Biplane Vol: 33.6 ml 18.13 ml/m AORTIC VALVE LVOT Vmax:   112.00 cm/s LVOT Vmean:  73.100 cm/s LVOT VTI:    0.264 m  AORTA Ao Root diam: 2.90 cm Ao Asc diam:  3.20 cm  MITRAL VALVE                TRICUSPID VALVE TR Peak grad:   25.4 mmHg TR Vmax:        252.00 cm/s MV E velocity: 101.00 cm/s  Estimated RAP:  3.00 mmHg MV A velocity: 87.30 cm/s   RVSP:           28.4 mmHg MV E/A ratio:  1.16 SHUNTS Systemic VTI:  0.26 m Systemic Diam: 2.00 cm  Lonni Nanas MD Electronically signed by Lonni Nanas MD Signature Date/Time: 10/19/2020/11:16:25 AM    Final          ______________________________________________________________________________________________      Risk Assessment/Calculations:             Physical Exam:   VS:  BP 120/80   Pulse 76   Ht 5' 5 (1.651 m)   Wt 188 lb (85.3 kg)   SpO2 94%   BMI 31.28 kg/m    Wt Readings from Last 3 Encounters:  12/04/23 188 lb (85.3 kg)  08/07/23 189 lb (85.7 kg)  07/31/23 187 lb (84.8 kg)    GEN: Well nourished, well developed in no acute distress NECK: No JVD; No carotid bruits CARDIAC: RRR, no murmurs, rubs, gallops RESPIRATORY:  Clear to auscultation without rales, wheezing or rhonchi  ABDOMEN: Soft, non-tender,  non-distended EXTREMITIES:  No edema; No deformity   ASSESSMENT AND PLAN: .   Hypertension-her blood pressure is well-controlled today at 120/80, continue telmisartan -HCT 40-12.5 mg daily, we had suggested she come back for repeat BMET 2 weeks after starting this however does not appear this was completed.  Will repeat CMET today.  Aortic atherosclerosis-this is noted on CT of the abdomen in 2023. Stable with no anginal symptoms. No indication for ischemic evaluation.  Relatively sedentary, encouraged increased physical activity and work on weight loss.  Currently on Crestor  20 mg daily.  Pedal edema/venous insufficiency-had previously been on spironolactone  however this was discontinued secondary to dizziness.  Takes Lasix  as needed for pedal edema.  Hyperlipidemia-most recent LDL was well-controlled at 64.  Continue Crestor  20 mg daily.  Will repeat c-Met and direct LDL.  Obesity-BMI is 31, has been difficult for her to lose weight, deviously checked her thyroid  which was noncontributory.  Previously referred her to healthy weight and wellness.   Dispo: CMET, direct LDL, follow-up in 1 year.  Signed, Delon JAYSON Hoover, NP

## 2023-12-04 ENCOUNTER — Ambulatory Visit: Attending: Cardiology | Admitting: Cardiology

## 2023-12-04 ENCOUNTER — Encounter: Payer: Self-pay | Admitting: Cardiology

## 2023-12-04 VITALS — BP 120/80 | HR 76 | Ht 65.0 in | Wt 188.0 lb

## 2023-12-04 DIAGNOSIS — I700 Atherosclerosis of aorta: Secondary | ICD-10-CM | POA: Diagnosis not present

## 2023-12-04 DIAGNOSIS — I10 Essential (primary) hypertension: Secondary | ICD-10-CM | POA: Diagnosis not present

## 2023-12-04 DIAGNOSIS — E782 Mixed hyperlipidemia: Secondary | ICD-10-CM | POA: Diagnosis not present

## 2023-12-04 DIAGNOSIS — E669 Obesity, unspecified: Secondary | ICD-10-CM

## 2023-12-04 DIAGNOSIS — R600 Localized edema: Secondary | ICD-10-CM

## 2023-12-04 NOTE — Patient Instructions (Addendum)
 Medication Instructions:   You may take a 2 week holiday from Crestor - Lets us  know how you feel- If better let us  know so we can do something different for Lipid treatment. If you are the same - continue Crestor    Lab Work: Your physician recommends that you return for lab work in:  when lab open You need to have labs done when you are fasting.  You can come Monday through Friday 8:30 am to 12:00 pm and 1:15 to 4:30. You do not need to make an appointment as the order has already been placed. The labs you are going to have done are Direct LDL, CMP.    Testing/Procedures: None Ordered   Follow-Up: At [facility], you and your health needs are our priority.  As part of our continuing mission to provide you with exceptional heart care, we have created designated Provider Care Teams.  These Care Teams include your primary Cardiologist (physician) and Advanced Practice Providers (APPs -  Physician Assistants and Nurse Practitioners) who all work together to provide you with the care you need, when you need it.  We recommend signing up for the patient portal called MyChart.  Sign up information is provided on this After Visit Summary.  MyChart is used to connect with patients for Virtual Visits (Telemedicine).  Patients are able to view lab/test results, encounter notes, upcoming appointments, etc.  Non-urgent messages can be sent to your provider as well.   To learn more about what you can do with MyChart, go to [email or web address].    Your next appointment:   12 month(s)  The format for your next appointment:   In Person  Provider:   Lamar Fitch, MD    Other Instructions NA

## 2023-12-13 ENCOUNTER — Ambulatory Visit: Payer: Self-pay | Admitting: Cardiology

## 2023-12-13 LAB — COMPREHENSIVE METABOLIC PANEL WITH GFR
ALT: 26 IU/L (ref 0–32)
AST: 28 IU/L (ref 0–40)
Albumin: 4.2 g/dL (ref 3.9–4.9)
Alkaline Phosphatase: 88 IU/L (ref 44–121)
BUN/Creatinine Ratio: 17 (ref 12–28)
BUN: 11 mg/dL (ref 8–27)
Bilirubin Total: 0.8 mg/dL (ref 0.0–1.2)
CO2: 22 mmol/L (ref 20–29)
Calcium: 9.4 mg/dL (ref 8.7–10.3)
Chloride: 102 mmol/L (ref 96–106)
Creatinine, Ser: 0.66 mg/dL (ref 0.57–1.00)
Globulin, Total: 2 g/dL (ref 1.5–4.5)
Glucose: 91 mg/dL (ref 70–99)
Potassium: 4.1 mmol/L (ref 3.5–5.2)
Sodium: 140 mmol/L (ref 134–144)
Total Protein: 6.2 g/dL (ref 6.0–8.5)
eGFR: 98 mL/min/1.73

## 2023-12-13 LAB — LDL CHOLESTEROL, DIRECT: LDL Direct: 161 mg/dL — ABNORMAL HIGH (ref 0–99)

## 2023-12-23 ENCOUNTER — Encounter (HOSPITAL_BASED_OUTPATIENT_CLINIC_OR_DEPARTMENT_OTHER): Payer: Self-pay

## 2023-12-23 ENCOUNTER — Ambulatory Visit (HOSPITAL_BASED_OUTPATIENT_CLINIC_OR_DEPARTMENT_OTHER)
Admission: RE | Admit: 2023-12-23 | Discharge: 2023-12-23 | Disposition: A | Discharge status: Home / Self Care | Source: Ambulatory Visit | Attending: Family Medicine | Admitting: Family Medicine

## 2023-12-23 VITALS — BP 112/76 | HR 96 | Temp 98.1°F | Resp 18

## 2023-12-23 DIAGNOSIS — J0140 Acute pansinusitis, unspecified: Secondary | ICD-10-CM

## 2023-12-23 DIAGNOSIS — J358 Other chronic diseases of tonsils and adenoids: Secondary | ICD-10-CM | POA: Diagnosis not present

## 2023-12-23 MED ORDER — CEFDINIR 300 MG PO CAPS
300.0000 mg | ORAL_CAPSULE | Freq: Two times a day (BID) | ORAL | 0 refills | Status: AC
Start: 2023-12-23 — End: 2023-12-30

## 2023-12-23 MED ORDER — FLUTICASONE PROPIONATE 50 MCG/ACT NA SUSP: 1.0000 | Freq: Two times a day (BID) | NASAL | 0 refills | Status: AC | PRN

## 2023-12-23 NOTE — ED Triage Notes (Signed)
 Pt c/o sore throat since Tuesday and now have white spots that started on Saturday on tonsils plus she has chest congestion and cough. Pt was tested on Friday for strep and it was negative.
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
## 2023-12-23 NOTE — Discharge Instructions (Signed)
 Sinusitis: Cefdinir  300 mg twice daily for 7 days.  Patient has a penicillin allergy and thinks she took amoxicillin without trouble but she is not sure.  She will watch for any signs of allergic reaction to the cefdinir .  Fluticasone  nasal spray, 1 spray into each nostril twice daily for nasal congestion.  Patient had some kind of intolerance of prednisone but does not remember what it was.  She will watch for any intolerances of fluticasone  nasal spray.  Encouraged sinus rinses twice daily.  Get plenty of fluids and rest.  Tonsil stones: Only 1 tonsil stone seen on exam right now.  No discharge or pus on the tonsils.  Encouraged sinus rinses.  Follow-up as needed.  Follow-up if symptoms do not improve, worsen or new symptoms occur.

## 2023-12-31 ENCOUNTER — Other Ambulatory Visit (HOSPITAL_COMMUNITY): Payer: Self-pay

## 2023-12-31 ENCOUNTER — Other Ambulatory Visit: Payer: Self-pay

## 2024-01-27 ENCOUNTER — Telehealth: Admitting: Nurse Practitioner

## 2024-01-27 DIAGNOSIS — U071 COVID-19: Secondary | ICD-10-CM

## 2024-01-27 MED ORDER — NIRMATRELVIR/RITONAVIR (PAXLOVID)TABLET: 3.0000 | ORAL_TABLET | Freq: Two times a day (BID) | ORAL | 0 refills | Status: AC

## 2024-01-27 NOTE — Patient Instructions (Signed)
 Santana KATHEE Conger, thank you for joining Haze LELON Servant, NP for today's virtual visit.  While this provider is not your primary care provider (PCP), if your PCP is located in our provider database this encounter information will be shared with them immediately following your visit.   A [facility] MyChart account gives you access to today's visit and all your visits, tests, and labs performed at [facility]  click here if you don't have a [facility] MyChart account or go to [email or web address]  Consent: (Patient) Penny Valdez provided verbal consent for this virtual visit at the beginning of the encounter.  Current Medications:  Current Outpatient Medications:    nirmatrelvir /ritonavir  (PAXLOVID ) 20 x 150 MG & 10 x 100MG  TABS, Take 3 tablets by mouth 2 (two) times daily for 5 days. (Take nirmatrelvir  150 mg two tablets twice daily for 5 days and ritonavir  100 mg one tablet twice daily for 5 days) Patient GFR is 98, Disp: 30 tablet, Rfl: 0   fluticasone  (FLONASE ) 50 MCG/ACT nasal spray, Place 1 spray into both nostrils 2 (two) times daily as needed for rhinitis., Disp: 17 mL, Rfl: 0   Lansoprazole  (PREVACID  PO), Take 1 tablet by mouth daily., Disp: , Rfl:    MIEBO  1.338 GM/ML SOLN, Place 1 drop into both eyes 2 (two) times daily., Disp: 3 mL, Rfl: 5   rosuvastatin  (CRESTOR ) 20 MG tablet, Take 1 tablet (20 mg total) by mouth daily., Disp: 90 tablet, Rfl: 3   telmisartan -hydrochlorothiazide (MICARDIS  HCT) 40-12.5 MG tablet, Take 1 tablet by mouth daily., Disp: 90 tablet, Rfl: 3   Medications ordered in this encounter:  Meds ordered this encounter  Medications   nirmatrelvir /ritonavir  (PAXLOVID ) 20 x 150 MG & 10 x 100MG  TABS    Sig: Take 3 tablets by mouth 2 (two) times daily for 5 days. (Take nirmatrelvir  150 mg two tablets twice daily for 5 days and ritonavir  100 mg one tablet twice daily for 5 days) Patient GFR is 98    Dispense:  30 tablet    Refill:  0    Supervising  Provider:   BLAISE ALEENE KIDD [phone]     *If you need refills on other medications prior to your next appointment, please contact your pharmacy*  Follow-Up: Call back or seek an in-person evaluation if the symptoms worsen or if the condition fails to improve as anticipated.  [facility] [phone]  Other Instructions  Please keep well-hydrated and get plenty of rest. Start a saline nasal rinse to flush out your nasal passages. You can use plain Mucinex to help thin congestion. If you have a humidifier, you can use this daily as needed.    You are to wear a mask for 5 days from onset of your symptoms.  After day 5, if you have had no fever and you are feeling better with NO symptoms, you can end masking. Keep in mind you can be contagious 10 days from the onset of symptoms  After day 5 if you have a fever or are having significant symptoms, please wear your mask for full 10 days.   If you note any worsening of symptoms, any significant shortness of breath or any chest pain, please seek ER evaluation ASAP.  Please do not delay care!    If you note any worsening of symptoms, any significant shortness of breath or any chest pain, please seek ER evaluation ASAP.  Please do not delay care!    If you have  been instructed to have an in-person evaluation today at a local Urgent Care facility, please use the link below. It will take you to a list of all of our available [facility], including address, phone number and hours of operation. Please do not delay care.  [facility]  If you or a family member do not have a primary care provider, use the link below to schedule a visit and establish care. When you choose a [facility] primary care physician or advanced practice provider, you gain a long-term partner in health. Find a Primary Care Provider  Learn more about [facility]'s in-office and virtual care options: [facility] - Get Care Now

## 2024-01-27 NOTE — Progress Notes (Signed)
 Virtual Visit Consent   Penny Valdez, you are scheduled for a virtual visit with a [facility] provider today. Just as with appointments in the office, your consent must be obtained to participate. Your consent will be active for this visit and any virtual visit you may have with one of our providers in the next 365 days. If you have a MyChart account, a copy of this consent can be sent to you electronically.  As this is a virtual visit, video technology does not allow for your provider to perform a traditional examination. This may limit your provider's ability to fully assess your condition. If your provider identifies any concerns that need to be evaluated in person or the need to arrange testing (such as labs, EKG, etc.), we will make arrangements to do so. Although advances in technology are sophisticated, we cannot ensure that it will always work on either your end or our end. If the connection with a video visit is poor, the visit may have to be switched to a telephone visit. With either a video or telephone visit, we are not always able to ensure that we have a secure connection.  By engaging in this virtual visit, you consent to the provision of healthcare and authorize for your insurance to be billed (if applicable) for the services provided during this visit. Depending on your insurance coverage, you may receive a charge related to this service.  I need to obtain your verbal consent now. Are you willing to proceed with your visit today? CITLALY CAMPLIN has provided verbal consent on 01/27/2024 for a virtual visit (video or telephone). Haze LELON Servant, NP  Date: 01/27/2024 6:13 PM   Virtual Visit via Video Note   I, Haze LELON Servant, connected with  Penny Valdez  ([id number], [birth year]) on 01/27/24 at  6:15 PM EDT by a video-enabled telemedicine application and verified that I am speaking with the correct person using two identifiers.  Location: Patient: Virtual Visit Location Patient:  Home Provider: Virtual Visit Location Provider: Home Office   I discussed the limitations of evaluation and management by telemedicine and the availability of in person appointments. The patient expressed understanding and agreed to proceed.    History of Present Illness: Penny Valdez is a 63 y.o. who identifies as a female who was assigned female at birth, and is being seen today for COVID-positive.  Penny Valdez tested positive for COVID today via home antigen test.  She started experiencing symptoms yesterday including runny nose, frontal sinus pressure, vocal hoarseness and headache.  She denies fever, chest pain or shortness breath.  She has taken Paxlovid  in the past and is agreeable to starting this today.  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   Migraine with aura and without status migrainosus, not intractable 07/02/2019   Forehead pain 02/04/2019   Hypertension 02/04/2019   Fatigue 02/04/2019   Nonintractable headache 11/19/2018   Eye redness 11/19/2018   Conjunctivitis of both eyes 10/22/2018   Infection due to 2019-nCoV 10/04/2018   Nausea and vomiting 10/04/2018   Edema 05/24/2018   Leg cramps 05/24/2018   Actinic keratoses 08/03/2017   Arthritis of knee 04/30/2017  Ganglion of flexor tendon sheath of right middle finger 04/30/2017   History of vitamin D  deficiency 04/30/2017   Bilateral leg edema 02/26/2016   Gilbert's syndrome 02/25/2016   Cephalalgia 02/25/2016   Thyroid  nodule, hot 01/11/2015   Vitamin D  deficiency 08/06/2014   Tubular adenoma of colon 08/01/2013   Osteopenia 07/02/2012   Obstructive sleep apnea  07/14/2010   Headache(784.0) 05/03/2010   Diverticulosis of colon 11/04/2008   Uterine cancer (HCC) 10/16/2008   ESOPHAGEAL STRICTURE 08/27/2008   Endometrial carcinoma (HCC) 06/12/2008   Depression 06/13/2007   Hyperlipidemia 01/29/2007   GERD (gastroesophageal reflux disease) 01/29/2007    Allergies:  Allergies  Allergen Reactions   Nizatidine     REACTION: swelling of hands & feet with Axid   Omeprazole Anaphylaxis   Atorvastatin Other (See Comments)    Joint pain   Penicillins     As child; does not remember   Prednisone     Facial swelling from dosepack from [facility] 2013   Prochlorperazine Edisylate Other (See Comments)    Looses muscle control   Tobramycin     Eye irritation   Esomeprazole Magnesium     Mental status changes   Latex Rash   Medications:  Current Outpatient Medications:    nirmatrelvir /ritonavir  (PAXLOVID ) 20 x 150 MG & 10 x 100MG  TABS, Take 3 tablets by mouth 2 (two) times daily for 5 days. (Take nirmatrelvir  150 mg two tablets twice daily for 5 days and ritonavir  100 mg one tablet twice daily for 5 days) Patient GFR is 98, Disp: 30 tablet, Rfl: 0   fluticasone  (FLONASE ) 50 MCG/ACT nasal spray, Place 1 spray into both nostrils 2 (two) times daily as needed for rhinitis., Disp: 17 mL, Rfl: 0   Lansoprazole  (PREVACID  PO), Take 1 tablet by mouth daily., Disp: , Rfl:    MIEBO  1.338 GM/ML SOLN, Place 1 drop into both eyes 2 (two) times daily., Disp: 3 mL, Rfl: 5   rosuvastatin  (CRESTOR ) 20 MG tablet, Take 1 tablet (20 mg total) by mouth daily., Disp: 90 tablet, Rfl: 3   telmisartan -hydrochlorothiazide (MICARDIS  HCT) 40-12.5 MG tablet, Take 1 tablet by mouth daily., Disp: 90 tablet, Rfl: 3  Observations/Objective: Patient is well-developed, well-nourished in no acute distress.  Resting comfortably at home.  Head is normocephalic, atraumatic.  No labored breathing.  Speech is clear and coherent with logical content.  Patient is alert and oriented at  baseline.    Assessment and Plan: 1. Positive self-administered antigen test for COVID-19 (Primary) - nirmatrelvir /ritonavir  (PAXLOVID ) 20 x 150 MG & 10 x 100MG  TABS; Take 3 tablets by mouth 2 (two) times daily for 5 days. (Take nirmatrelvir  150 mg two tablets twice daily for 5 days and ritonavir  100 mg one tablet twice daily for 5 days) Patient GFR is 98  Dispense: 30 tablet; Refill: 0   Please keep well-hydrated and get plenty of rest. Start a saline nasal rinse to flush out your nasal passages. You can use plain Mucinex to help thin congestion. If you have a humidifier, you can use this daily as needed.    You are to wear a mask for 5 days from onset of your symptoms.  After day 5, if you have had no fever and you are feeling better with NO symptoms, you can end masking. Keep in mind you can be contagious 10 days from the onset of symptoms  After day 5 if you have a fever or are having significant symptoms, please wear your  mask for full 10 days.   If you note any worsening of symptoms, any significant shortness of breath or any chest pain, please seek ER evaluation ASAP.  Please do not delay care!    If you note any worsening of symptoms, any significant shortness of breath or any chest pain, please seek ER evaluation ASAP.  Please do not delay care!   Follow Up Instructions: I discussed the assessment and treatment plan with the patient. The patient was provided an opportunity to ask questions and all were answered. The patient agreed with the plan and demonstrated an understanding of the instructions.  A copy of instructions were sent to the patient via MyChart unless otherwise noted below.    The patient was advised to call back or seek an in-person evaluation if the symptoms worsen or if the condition fails to improve as anticipated.    Virgel Haro W Alithea Lapage, NP

## 2024-01-28 ENCOUNTER — Other Ambulatory Visit (HOSPITAL_COMMUNITY): Payer: Self-pay

## 2024-03-16 ENCOUNTER — Other Ambulatory Visit: Payer: Self-pay | Admitting: Cardiology

## 2024-04-06 ENCOUNTER — Other Ambulatory Visit (HOSPITAL_COMMUNITY): Payer: Self-pay

## 2024-04-07 ENCOUNTER — Other Ambulatory Visit (HOSPITAL_COMMUNITY): Payer: Self-pay

## 2024-05-20 ENCOUNTER — Other Ambulatory Visit: Payer: Self-pay | Admitting: Internal Medicine

## 2024-05-22 ENCOUNTER — Other Ambulatory Visit: Payer: Self-pay

## 2024-05-22 ENCOUNTER — Other Ambulatory Visit (HOSPITAL_COMMUNITY): Payer: Self-pay

## 2024-05-22 MED ORDER — MIEBO 1.338 GM/ML OP SOLN
1.0000 [drp] | Freq: Two times a day (BID) | OPHTHALMIC | 5 refills | Status: AC
  Filled 2024-05-22: qty 3, 30d supply, fill #0
  Filled 2024-07-10: qty 3, 15d supply, fill #1
  Filled 2024-07-18: qty 3, 30d supply, fill #1

## 2024-07-10 ENCOUNTER — Other Ambulatory Visit: Payer: Self-pay

## 2024-07-11 ENCOUNTER — Other Ambulatory Visit: Payer: Self-pay

## 2024-07-18 ENCOUNTER — Other Ambulatory Visit: Payer: Self-pay

## 2024-07-18 ENCOUNTER — Other Ambulatory Visit (HOSPITAL_COMMUNITY): Payer: Self-pay
